# Patient Record
Sex: Female | Born: 1940 | Race: White | Hispanic: No | Smoking: Former smoker
Health system: Southern US, Community
[De-identification: ages and names within clinical notes are randomized; demographics above are authoritative.]

## PROBLEM LIST (undated history)

## (undated) DIAGNOSIS — F0390 Unspecified dementia without behavioral disturbance: Secondary | ICD-10-CM

## (undated) HISTORY — DX: Unspecified dementia, unspecified severity, without behavioral disturbance, psychotic disturbance, mood disturbance, and anxiety: F03.90

---

## 2015-12-15 DIAGNOSIS — H9190 Unspecified hearing loss, unspecified ear: Secondary | ICD-10-CM | POA: Diagnosis not present

## 2015-12-15 DIAGNOSIS — I10 Essential (primary) hypertension: Secondary | ICD-10-CM | POA: Diagnosis not present

## 2015-12-15 DIAGNOSIS — R2232 Localized swelling, mass and lump, left upper limb: Secondary | ICD-10-CM | POA: Diagnosis not present

## 2015-12-15 DIAGNOSIS — H6120 Impacted cerumen, unspecified ear: Secondary | ICD-10-CM | POA: Diagnosis not present

## 2015-12-15 DIAGNOSIS — Z111 Encounter for screening for respiratory tuberculosis: Secondary | ICD-10-CM | POA: Diagnosis not present

## 2015-12-16 DIAGNOSIS — I10 Essential (primary) hypertension: Secondary | ICD-10-CM | POA: Diagnosis not present

## 2015-12-17 DIAGNOSIS — H6120 Impacted cerumen, unspecified ear: Secondary | ICD-10-CM | POA: Diagnosis not present

## 2015-12-17 DIAGNOSIS — H9190 Unspecified hearing loss, unspecified ear: Secondary | ICD-10-CM | POA: Diagnosis not present

## 2015-12-22 DIAGNOSIS — R2232 Localized swelling, mass and lump, left upper limb: Secondary | ICD-10-CM | POA: Diagnosis not present

## 2015-12-28 DIAGNOSIS — I10 Essential (primary) hypertension: Secondary | ICD-10-CM | POA: Diagnosis not present

## 2015-12-28 DIAGNOSIS — W19XXXA Unspecified fall, initial encounter: Secondary | ICD-10-CM | POA: Diagnosis not present

## 2015-12-28 DIAGNOSIS — S72002A Fracture of unspecified part of neck of left femur, initial encounter for closed fracture: Secondary | ICD-10-CM | POA: Diagnosis not present

## 2015-12-28 DIAGNOSIS — F0390 Unspecified dementia without behavioral disturbance: Secondary | ICD-10-CM | POA: Diagnosis not present

## 2015-12-28 DIAGNOSIS — R259 Unspecified abnormal involuntary movements: Secondary | ICD-10-CM | POA: Diagnosis not present

## 2015-12-28 DIAGNOSIS — S72052A Unspecified fracture of head of left femur, initial encounter for closed fracture: Secondary | ICD-10-CM | POA: Diagnosis not present

## 2015-12-28 DIAGNOSIS — M25552 Pain in left hip: Secondary | ICD-10-CM | POA: Diagnosis not present

## 2015-12-28 DIAGNOSIS — Z87891 Personal history of nicotine dependence: Secondary | ICD-10-CM | POA: Diagnosis not present

## 2015-12-28 DIAGNOSIS — W19XXXD Unspecified fall, subsequent encounter: Secondary | ICD-10-CM | POA: Diagnosis not present

## 2015-12-28 DIAGNOSIS — Z888 Allergy status to other drugs, medicaments and biological substances status: Secondary | ICD-10-CM | POA: Diagnosis not present

## 2015-12-28 DIAGNOSIS — S72092A Other fracture of head and neck of left femur, initial encounter for closed fracture: Secondary | ICD-10-CM | POA: Diagnosis not present

## 2015-12-28 DIAGNOSIS — S72002D Fracture of unspecified part of neck of left femur, subsequent encounter for closed fracture with routine healing: Secondary | ICD-10-CM | POA: Diagnosis not present

## 2015-12-28 DIAGNOSIS — S299XXA Unspecified injury of thorax, initial encounter: Secondary | ICD-10-CM | POA: Diagnosis not present

## 2015-12-28 DIAGNOSIS — S72042A Displaced fracture of base of neck of left femur, initial encounter for closed fracture: Secondary | ICD-10-CM | POA: Diagnosis not present

## 2015-12-31 DIAGNOSIS — M25552 Pain in left hip: Secondary | ICD-10-CM | POA: Diagnosis not present

## 2015-12-31 DIAGNOSIS — R262 Difficulty in walking, not elsewhere classified: Secondary | ICD-10-CM | POA: Diagnosis not present

## 2015-12-31 DIAGNOSIS — D649 Anemia, unspecified: Secondary | ICD-10-CM | POA: Diagnosis not present

## 2015-12-31 DIAGNOSIS — W19XXXA Unspecified fall, initial encounter: Secondary | ICD-10-CM | POA: Diagnosis not present

## 2015-12-31 DIAGNOSIS — R259 Unspecified abnormal involuntary movements: Secondary | ICD-10-CM | POA: Diagnosis not present

## 2015-12-31 DIAGNOSIS — G8918 Other acute postprocedural pain: Secondary | ICD-10-CM | POA: Diagnosis not present

## 2015-12-31 DIAGNOSIS — W19XXXD Unspecified fall, subsequent encounter: Secondary | ICD-10-CM | POA: Diagnosis not present

## 2015-12-31 DIAGNOSIS — S72002D Fracture of unspecified part of neck of left femur, subsequent encounter for closed fracture with routine healing: Secondary | ICD-10-CM | POA: Diagnosis not present

## 2015-12-31 DIAGNOSIS — S72002A Fracture of unspecified part of neck of left femur, initial encounter for closed fracture: Secondary | ICD-10-CM | POA: Diagnosis not present

## 2015-12-31 DIAGNOSIS — I10 Essential (primary) hypertension: Secondary | ICD-10-CM | POA: Diagnosis not present

## 2015-12-31 DIAGNOSIS — F0390 Unspecified dementia without behavioral disturbance: Secondary | ICD-10-CM | POA: Diagnosis not present

## 2015-12-31 DIAGNOSIS — S72043A Displaced fracture of base of neck of unspecified femur, initial encounter for closed fracture: Secondary | ICD-10-CM | POA: Diagnosis not present

## 2016-01-07 DIAGNOSIS — R262 Difficulty in walking, not elsewhere classified: Secondary | ICD-10-CM | POA: Diagnosis not present

## 2016-01-07 DIAGNOSIS — G8918 Other acute postprocedural pain: Secondary | ICD-10-CM | POA: Diagnosis not present

## 2016-01-07 DIAGNOSIS — D649 Anemia, unspecified: Secondary | ICD-10-CM | POA: Diagnosis not present

## 2016-01-07 DIAGNOSIS — S72002D Fracture of unspecified part of neck of left femur, subsequent encounter for closed fracture with routine healing: Secondary | ICD-10-CM | POA: Diagnosis not present

## 2016-01-12 DIAGNOSIS — S72043A Displaced fracture of base of neck of unspecified femur, initial encounter for closed fracture: Secondary | ICD-10-CM | POA: Diagnosis not present

## 2016-02-15 DIAGNOSIS — D649 Anemia, unspecified: Secondary | ICD-10-CM | POA: Diagnosis not present

## 2016-02-15 DIAGNOSIS — R5381 Other malaise: Secondary | ICD-10-CM | POA: Diagnosis not present

## 2016-02-15 DIAGNOSIS — K219 Gastro-esophageal reflux disease without esophagitis: Secondary | ICD-10-CM | POA: Diagnosis not present

## 2016-02-15 DIAGNOSIS — M439 Deforming dorsopathy, unspecified: Secondary | ICD-10-CM | POA: Diagnosis not present

## 2016-02-15 DIAGNOSIS — M2550 Pain in unspecified joint: Secondary | ICD-10-CM | POA: Diagnosis not present

## 2016-02-15 DIAGNOSIS — I119 Hypertensive heart disease without heart failure: Secondary | ICD-10-CM | POA: Diagnosis not present

## 2016-02-15 DIAGNOSIS — F0390 Unspecified dementia without behavioral disturbance: Secondary | ICD-10-CM | POA: Diagnosis not present

## 2016-02-24 DIAGNOSIS — I509 Heart failure, unspecified: Secondary | ICD-10-CM | POA: Diagnosis not present

## 2016-02-24 DIAGNOSIS — D649 Anemia, unspecified: Secondary | ICD-10-CM | POA: Diagnosis not present

## 2016-03-15 DIAGNOSIS — R5381 Other malaise: Secondary | ICD-10-CM | POA: Diagnosis not present

## 2016-03-15 DIAGNOSIS — M2550 Pain in unspecified joint: Secondary | ICD-10-CM | POA: Diagnosis not present

## 2016-03-15 DIAGNOSIS — K219 Gastro-esophageal reflux disease without esophagitis: Secondary | ICD-10-CM | POA: Diagnosis not present

## 2016-03-15 DIAGNOSIS — M439 Deforming dorsopathy, unspecified: Secondary | ICD-10-CM | POA: Diagnosis not present

## 2016-03-15 DIAGNOSIS — D649 Anemia, unspecified: Secondary | ICD-10-CM | POA: Diagnosis not present

## 2016-03-15 DIAGNOSIS — F0390 Unspecified dementia without behavioral disturbance: Secondary | ICD-10-CM | POA: Diagnosis not present

## 2016-03-15 DIAGNOSIS — I119 Hypertensive heart disease without heart failure: Secondary | ICD-10-CM | POA: Diagnosis not present

## 2016-04-14 DIAGNOSIS — K5900 Constipation, unspecified: Secondary | ICD-10-CM | POA: Diagnosis not present

## 2016-04-14 DIAGNOSIS — I119 Hypertensive heart disease without heart failure: Secondary | ICD-10-CM | POA: Diagnosis not present

## 2016-04-14 DIAGNOSIS — F0390 Unspecified dementia without behavioral disturbance: Secondary | ICD-10-CM | POA: Diagnosis not present

## 2016-04-14 DIAGNOSIS — M439 Deforming dorsopathy, unspecified: Secondary | ICD-10-CM | POA: Diagnosis not present

## 2016-04-14 DIAGNOSIS — D649 Anemia, unspecified: Secondary | ICD-10-CM | POA: Diagnosis not present

## 2016-04-14 DIAGNOSIS — M2550 Pain in unspecified joint: Secondary | ICD-10-CM | POA: Diagnosis not present

## 2016-04-14 DIAGNOSIS — R627 Adult failure to thrive: Secondary | ICD-10-CM | POA: Diagnosis not present

## 2016-04-19 DIAGNOSIS — S72043A Displaced fracture of base of neck of unspecified femur, initial encounter for closed fracture: Secondary | ICD-10-CM | POA: Diagnosis not present

## 2016-05-14 DIAGNOSIS — I119 Hypertensive heart disease without heart failure: Secondary | ICD-10-CM | POA: Diagnosis not present

## 2016-05-14 DIAGNOSIS — K5900 Constipation, unspecified: Secondary | ICD-10-CM | POA: Diagnosis not present

## 2016-05-14 DIAGNOSIS — D649 Anemia, unspecified: Secondary | ICD-10-CM | POA: Diagnosis not present

## 2016-05-14 DIAGNOSIS — F0390 Unspecified dementia without behavioral disturbance: Secondary | ICD-10-CM | POA: Diagnosis not present

## 2016-05-14 DIAGNOSIS — R627 Adult failure to thrive: Secondary | ICD-10-CM | POA: Diagnosis not present

## 2016-05-14 DIAGNOSIS — M2550 Pain in unspecified joint: Secondary | ICD-10-CM | POA: Diagnosis not present

## 2016-05-14 DIAGNOSIS — M439 Deforming dorsopathy, unspecified: Secondary | ICD-10-CM | POA: Diagnosis not present

## 2016-06-15 DIAGNOSIS — M439 Deforming dorsopathy, unspecified: Secondary | ICD-10-CM | POA: Diagnosis not present

## 2016-06-15 DIAGNOSIS — F0390 Unspecified dementia without behavioral disturbance: Secondary | ICD-10-CM | POA: Diagnosis not present

## 2016-06-15 DIAGNOSIS — R5381 Other malaise: Secondary | ICD-10-CM | POA: Diagnosis not present

## 2016-06-15 DIAGNOSIS — D649 Anemia, unspecified: Secondary | ICD-10-CM | POA: Diagnosis not present

## 2016-06-15 DIAGNOSIS — I119 Hypertensive heart disease without heart failure: Secondary | ICD-10-CM | POA: Diagnosis not present

## 2016-06-15 DIAGNOSIS — M2550 Pain in unspecified joint: Secondary | ICD-10-CM | POA: Diagnosis not present

## 2016-06-15 DIAGNOSIS — K219 Gastro-esophageal reflux disease without esophagitis: Secondary | ICD-10-CM | POA: Diagnosis not present

## 2016-07-14 DIAGNOSIS — I119 Hypertensive heart disease without heart failure: Secondary | ICD-10-CM | POA: Diagnosis not present

## 2016-07-14 DIAGNOSIS — F0390 Unspecified dementia without behavioral disturbance: Secondary | ICD-10-CM | POA: Diagnosis not present

## 2016-07-14 DIAGNOSIS — K219 Gastro-esophageal reflux disease without esophagitis: Secondary | ICD-10-CM | POA: Diagnosis not present

## 2016-07-14 DIAGNOSIS — M2550 Pain in unspecified joint: Secondary | ICD-10-CM | POA: Diagnosis not present

## 2016-07-14 DIAGNOSIS — R5381 Other malaise: Secondary | ICD-10-CM | POA: Diagnosis not present

## 2016-07-14 DIAGNOSIS — D649 Anemia, unspecified: Secondary | ICD-10-CM | POA: Diagnosis not present

## 2016-07-14 DIAGNOSIS — M439 Deforming dorsopathy, unspecified: Secondary | ICD-10-CM | POA: Diagnosis not present

## 2016-07-20 DIAGNOSIS — S72043A Displaced fracture of base of neck of unspecified femur, initial encounter for closed fracture: Secondary | ICD-10-CM | POA: Diagnosis not present

## 2016-08-15 DIAGNOSIS — I119 Hypertensive heart disease without heart failure: Secondary | ICD-10-CM | POA: Diagnosis not present

## 2016-08-15 DIAGNOSIS — R5381 Other malaise: Secondary | ICD-10-CM | POA: Diagnosis not present

## 2016-08-15 DIAGNOSIS — M2550 Pain in unspecified joint: Secondary | ICD-10-CM | POA: Diagnosis not present

## 2016-08-15 DIAGNOSIS — D649 Anemia, unspecified: Secondary | ICD-10-CM | POA: Diagnosis not present

## 2016-08-15 DIAGNOSIS — F0390 Unspecified dementia without behavioral disturbance: Secondary | ICD-10-CM | POA: Diagnosis not present

## 2016-08-15 DIAGNOSIS — K5900 Constipation, unspecified: Secondary | ICD-10-CM | POA: Diagnosis not present

## 2016-08-15 DIAGNOSIS — R627 Adult failure to thrive: Secondary | ICD-10-CM | POA: Diagnosis not present

## 2016-08-24 DIAGNOSIS — R05 Cough: Secondary | ICD-10-CM | POA: Diagnosis not present

## 2016-08-24 DIAGNOSIS — R0989 Other specified symptoms and signs involving the circulatory and respiratory systems: Secondary | ICD-10-CM | POA: Diagnosis not present

## 2016-08-31 DIAGNOSIS — I509 Heart failure, unspecified: Secondary | ICD-10-CM | POA: Diagnosis not present

## 2016-08-31 DIAGNOSIS — D649 Anemia, unspecified: Secondary | ICD-10-CM | POA: Diagnosis not present

## 2016-09-01 DIAGNOSIS — D2110 Benign neoplasm of connective and other soft tissue of unspecified upper limb, including shoulder: Secondary | ICD-10-CM | POA: Diagnosis not present

## 2016-09-01 DIAGNOSIS — I10 Essential (primary) hypertension: Secondary | ICD-10-CM | POA: Diagnosis not present

## 2016-09-01 DIAGNOSIS — D179 Benign lipomatous neoplasm, unspecified: Secondary | ICD-10-CM | POA: Diagnosis not present

## 2016-09-01 DIAGNOSIS — L819 Disorder of pigmentation, unspecified: Secondary | ICD-10-CM | POA: Diagnosis not present

## 2016-09-09 DIAGNOSIS — D2262 Melanocytic nevi of left upper limb, including shoulder: Secondary | ICD-10-CM | POA: Diagnosis not present

## 2016-09-09 DIAGNOSIS — D2362 Other benign neoplasm of skin of left upper limb, including shoulder: Secondary | ICD-10-CM | POA: Diagnosis not present

## 2016-09-09 DIAGNOSIS — D1779 Benign lipomatous neoplasm of other sites: Secondary | ICD-10-CM | POA: Diagnosis not present

## 2016-09-09 DIAGNOSIS — D1722 Benign lipomatous neoplasm of skin and subcutaneous tissue of left arm: Secondary | ICD-10-CM | POA: Diagnosis not present

## 2016-09-14 DIAGNOSIS — D649 Anemia, unspecified: Secondary | ICD-10-CM | POA: Diagnosis not present

## 2016-09-14 DIAGNOSIS — F0390 Unspecified dementia without behavioral disturbance: Secondary | ICD-10-CM | POA: Diagnosis not present

## 2016-09-14 DIAGNOSIS — K5900 Constipation, unspecified: Secondary | ICD-10-CM | POA: Diagnosis not present

## 2016-09-14 DIAGNOSIS — I119 Hypertensive heart disease without heart failure: Secondary | ICD-10-CM | POA: Diagnosis not present

## 2016-09-14 DIAGNOSIS — R627 Adult failure to thrive: Secondary | ICD-10-CM | POA: Diagnosis not present

## 2016-09-14 DIAGNOSIS — R5381 Other malaise: Secondary | ICD-10-CM | POA: Diagnosis not present

## 2016-09-14 DIAGNOSIS — M2550 Pain in unspecified joint: Secondary | ICD-10-CM | POA: Diagnosis not present

## 2016-10-14 DIAGNOSIS — R5381 Other malaise: Secondary | ICD-10-CM | POA: Diagnosis not present

## 2016-10-14 DIAGNOSIS — R627 Adult failure to thrive: Secondary | ICD-10-CM | POA: Diagnosis not present

## 2016-10-14 DIAGNOSIS — K5900 Constipation, unspecified: Secondary | ICD-10-CM | POA: Diagnosis not present

## 2016-10-14 DIAGNOSIS — M2550 Pain in unspecified joint: Secondary | ICD-10-CM | POA: Diagnosis not present

## 2016-10-14 DIAGNOSIS — F0390 Unspecified dementia without behavioral disturbance: Secondary | ICD-10-CM | POA: Diagnosis not present

## 2016-10-14 DIAGNOSIS — K219 Gastro-esophageal reflux disease without esophagitis: Secondary | ICD-10-CM | POA: Diagnosis not present

## 2016-10-14 DIAGNOSIS — D649 Anemia, unspecified: Secondary | ICD-10-CM | POA: Diagnosis not present

## 2016-10-14 DIAGNOSIS — M439 Deforming dorsopathy, unspecified: Secondary | ICD-10-CM | POA: Diagnosis not present

## 2016-10-20 DIAGNOSIS — L300 Nummular dermatitis: Secondary | ICD-10-CM | POA: Diagnosis not present

## 2016-11-14 DIAGNOSIS — R5381 Other malaise: Secondary | ICD-10-CM | POA: Diagnosis not present

## 2016-11-14 DIAGNOSIS — D649 Anemia, unspecified: Secondary | ICD-10-CM | POA: Diagnosis not present

## 2016-11-14 DIAGNOSIS — M439 Deforming dorsopathy, unspecified: Secondary | ICD-10-CM | POA: Diagnosis not present

## 2016-11-14 DIAGNOSIS — M2550 Pain in unspecified joint: Secondary | ICD-10-CM | POA: Diagnosis not present

## 2016-11-14 DIAGNOSIS — K219 Gastro-esophageal reflux disease without esophagitis: Secondary | ICD-10-CM | POA: Diagnosis not present

## 2016-11-14 DIAGNOSIS — K5900 Constipation, unspecified: Secondary | ICD-10-CM | POA: Diagnosis not present

## 2016-11-14 DIAGNOSIS — R627 Adult failure to thrive: Secondary | ICD-10-CM | POA: Diagnosis not present

## 2016-11-14 DIAGNOSIS — F0390 Unspecified dementia without behavioral disturbance: Secondary | ICD-10-CM | POA: Diagnosis not present

## 2016-11-28 DIAGNOSIS — B354 Tinea corporis: Secondary | ICD-10-CM | POA: Diagnosis not present

## 2016-11-28 DIAGNOSIS — B351 Tinea unguium: Secondary | ICD-10-CM | POA: Diagnosis not present

## 2016-11-28 DIAGNOSIS — B353 Tinea pedis: Secondary | ICD-10-CM | POA: Diagnosis not present

## 2016-12-14 DIAGNOSIS — F0390 Unspecified dementia without behavioral disturbance: Secondary | ICD-10-CM | POA: Diagnosis not present

## 2016-12-14 DIAGNOSIS — M2550 Pain in unspecified joint: Secondary | ICD-10-CM | POA: Diagnosis not present

## 2016-12-14 DIAGNOSIS — R29898 Other symptoms and signs involving the musculoskeletal system: Secondary | ICD-10-CM | POA: Diagnosis not present

## 2016-12-14 DIAGNOSIS — I119 Hypertensive heart disease without heart failure: Secondary | ICD-10-CM | POA: Diagnosis not present

## 2017-01-02 DIAGNOSIS — B351 Tinea unguium: Secondary | ICD-10-CM | POA: Diagnosis not present

## 2017-01-03 DIAGNOSIS — M25561 Pain in right knee: Secondary | ICD-10-CM | POA: Diagnosis not present

## 2017-01-07 DIAGNOSIS — R05 Cough: Secondary | ICD-10-CM | POA: Diagnosis not present

## 2017-01-16 DIAGNOSIS — K5900 Constipation, unspecified: Secondary | ICD-10-CM | POA: Diagnosis not present

## 2017-01-16 DIAGNOSIS — M810 Age-related osteoporosis without current pathological fracture: Secondary | ICD-10-CM | POA: Diagnosis not present

## 2017-01-16 DIAGNOSIS — I119 Hypertensive heart disease without heart failure: Secondary | ICD-10-CM | POA: Diagnosis not present

## 2017-01-16 DIAGNOSIS — M2550 Pain in unspecified joint: Secondary | ICD-10-CM | POA: Diagnosis not present

## 2017-01-16 DIAGNOSIS — K219 Gastro-esophageal reflux disease without esophagitis: Secondary | ICD-10-CM | POA: Diagnosis not present

## 2017-01-16 DIAGNOSIS — D649 Anemia, unspecified: Secondary | ICD-10-CM | POA: Diagnosis not present

## 2017-01-16 DIAGNOSIS — R627 Adult failure to thrive: Secondary | ICD-10-CM | POA: Diagnosis not present

## 2017-01-16 DIAGNOSIS — R29898 Other symptoms and signs involving the musculoskeletal system: Secondary | ICD-10-CM | POA: Diagnosis not present

## 2017-01-16 DIAGNOSIS — F0390 Unspecified dementia without behavioral disturbance: Secondary | ICD-10-CM | POA: Diagnosis not present

## 2017-02-15 DIAGNOSIS — R29898 Other symptoms and signs involving the musculoskeletal system: Secondary | ICD-10-CM | POA: Diagnosis not present

## 2017-02-15 DIAGNOSIS — K219 Gastro-esophageal reflux disease without esophagitis: Secondary | ICD-10-CM | POA: Diagnosis not present

## 2017-02-15 DIAGNOSIS — R627 Adult failure to thrive: Secondary | ICD-10-CM | POA: Diagnosis not present

## 2017-02-15 DIAGNOSIS — D649 Anemia, unspecified: Secondary | ICD-10-CM | POA: Diagnosis not present

## 2017-02-15 DIAGNOSIS — M439 Deforming dorsopathy, unspecified: Secondary | ICD-10-CM | POA: Diagnosis not present

## 2017-02-15 DIAGNOSIS — I119 Hypertensive heart disease without heart failure: Secondary | ICD-10-CM | POA: Diagnosis not present

## 2017-02-15 DIAGNOSIS — F0390 Unspecified dementia without behavioral disturbance: Secondary | ICD-10-CM | POA: Diagnosis not present

## 2017-02-15 DIAGNOSIS — M2550 Pain in unspecified joint: Secondary | ICD-10-CM | POA: Diagnosis not present

## 2017-03-01 DIAGNOSIS — I509 Heart failure, unspecified: Secondary | ICD-10-CM | POA: Diagnosis not present

## 2017-03-01 DIAGNOSIS — D649 Anemia, unspecified: Secondary | ICD-10-CM | POA: Diagnosis not present

## 2017-03-15 DIAGNOSIS — I119 Hypertensive heart disease without heart failure: Secondary | ICD-10-CM | POA: Diagnosis not present

## 2017-03-15 DIAGNOSIS — M439 Deforming dorsopathy, unspecified: Secondary | ICD-10-CM | POA: Diagnosis not present

## 2017-03-15 DIAGNOSIS — R29898 Other symptoms and signs involving the musculoskeletal system: Secondary | ICD-10-CM | POA: Diagnosis not present

## 2017-03-15 DIAGNOSIS — K219 Gastro-esophageal reflux disease without esophagitis: Secondary | ICD-10-CM | POA: Diagnosis not present

## 2017-03-15 DIAGNOSIS — M2550 Pain in unspecified joint: Secondary | ICD-10-CM | POA: Diagnosis not present

## 2017-03-15 DIAGNOSIS — D649 Anemia, unspecified: Secondary | ICD-10-CM | POA: Diagnosis not present

## 2017-03-15 DIAGNOSIS — R627 Adult failure to thrive: Secondary | ICD-10-CM | POA: Diagnosis not present

## 2017-03-15 DIAGNOSIS — F0390 Unspecified dementia without behavioral disturbance: Secondary | ICD-10-CM | POA: Diagnosis not present

## 2017-04-14 DIAGNOSIS — D649 Anemia, unspecified: Secondary | ICD-10-CM | POA: Diagnosis not present

## 2017-04-14 DIAGNOSIS — K219 Gastro-esophageal reflux disease without esophagitis: Secondary | ICD-10-CM | POA: Diagnosis not present

## 2017-04-14 DIAGNOSIS — M2550 Pain in unspecified joint: Secondary | ICD-10-CM | POA: Diagnosis not present

## 2017-04-14 DIAGNOSIS — R627 Adult failure to thrive: Secondary | ICD-10-CM | POA: Diagnosis not present

## 2017-04-14 DIAGNOSIS — M439 Deforming dorsopathy, unspecified: Secondary | ICD-10-CM | POA: Diagnosis not present

## 2017-04-14 DIAGNOSIS — F0390 Unspecified dementia without behavioral disturbance: Secondary | ICD-10-CM | POA: Diagnosis not present

## 2017-04-14 DIAGNOSIS — I119 Hypertensive heart disease without heart failure: Secondary | ICD-10-CM | POA: Diagnosis not present

## 2017-04-14 DIAGNOSIS — R29898 Other symptoms and signs involving the musculoskeletal system: Secondary | ICD-10-CM | POA: Diagnosis not present

## 2017-05-04 DIAGNOSIS — B351 Tinea unguium: Secondary | ICD-10-CM | POA: Diagnosis not present

## 2017-05-15 DIAGNOSIS — R29898 Other symptoms and signs involving the musculoskeletal system: Secondary | ICD-10-CM | POA: Diagnosis not present

## 2017-05-15 DIAGNOSIS — K219 Gastro-esophageal reflux disease without esophagitis: Secondary | ICD-10-CM | POA: Diagnosis not present

## 2017-05-15 DIAGNOSIS — D649 Anemia, unspecified: Secondary | ICD-10-CM | POA: Diagnosis not present

## 2017-05-15 DIAGNOSIS — R627 Adult failure to thrive: Secondary | ICD-10-CM | POA: Diagnosis not present

## 2017-05-15 DIAGNOSIS — F0390 Unspecified dementia without behavioral disturbance: Secondary | ICD-10-CM | POA: Diagnosis not present

## 2017-05-15 DIAGNOSIS — I119 Hypertensive heart disease without heart failure: Secondary | ICD-10-CM | POA: Diagnosis not present

## 2017-05-15 DIAGNOSIS — M439 Deforming dorsopathy, unspecified: Secondary | ICD-10-CM | POA: Diagnosis not present

## 2017-05-15 DIAGNOSIS — M2550 Pain in unspecified joint: Secondary | ICD-10-CM | POA: Diagnosis not present

## 2017-06-17 DIAGNOSIS — K219 Gastro-esophageal reflux disease without esophagitis: Secondary | ICD-10-CM | POA: Diagnosis not present

## 2017-06-17 DIAGNOSIS — F0390 Unspecified dementia without behavioral disturbance: Secondary | ICD-10-CM | POA: Diagnosis not present

## 2017-06-17 DIAGNOSIS — K5900 Constipation, unspecified: Secondary | ICD-10-CM | POA: Diagnosis not present

## 2017-06-17 DIAGNOSIS — M2550 Pain in unspecified joint: Secondary | ICD-10-CM | POA: Diagnosis not present

## 2017-06-17 DIAGNOSIS — D649 Anemia, unspecified: Secondary | ICD-10-CM | POA: Diagnosis not present

## 2017-06-17 DIAGNOSIS — R627 Adult failure to thrive: Secondary | ICD-10-CM | POA: Diagnosis not present

## 2017-06-17 DIAGNOSIS — M810 Age-related osteoporosis without current pathological fracture: Secondary | ICD-10-CM | POA: Diagnosis not present

## 2017-07-16 DIAGNOSIS — M2550 Pain in unspecified joint: Secondary | ICD-10-CM | POA: Diagnosis not present

## 2017-07-16 DIAGNOSIS — R627 Adult failure to thrive: Secondary | ICD-10-CM | POA: Diagnosis not present

## 2017-07-16 DIAGNOSIS — F0390 Unspecified dementia without behavioral disturbance: Secondary | ICD-10-CM | POA: Diagnosis not present

## 2017-07-16 DIAGNOSIS — M810 Age-related osteoporosis without current pathological fracture: Secondary | ICD-10-CM | POA: Diagnosis not present

## 2017-07-16 DIAGNOSIS — K219 Gastro-esophageal reflux disease without esophagitis: Secondary | ICD-10-CM | POA: Diagnosis not present

## 2017-07-16 DIAGNOSIS — K5900 Constipation, unspecified: Secondary | ICD-10-CM | POA: Diagnosis not present

## 2017-07-16 DIAGNOSIS — D649 Anemia, unspecified: Secondary | ICD-10-CM | POA: Diagnosis not present

## 2017-07-20 DIAGNOSIS — N390 Urinary tract infection, site not specified: Secondary | ICD-10-CM | POA: Diagnosis not present

## 2017-07-20 DIAGNOSIS — D649 Anemia, unspecified: Secondary | ICD-10-CM | POA: Diagnosis not present

## 2017-07-20 DIAGNOSIS — I509 Heart failure, unspecified: Secondary | ICD-10-CM | POA: Diagnosis not present

## 2017-07-20 DIAGNOSIS — R319 Hematuria, unspecified: Secondary | ICD-10-CM | POA: Diagnosis not present

## 2017-08-16 DIAGNOSIS — K219 Gastro-esophageal reflux disease without esophagitis: Secondary | ICD-10-CM | POA: Diagnosis not present

## 2017-08-16 DIAGNOSIS — R29898 Other symptoms and signs involving the musculoskeletal system: Secondary | ICD-10-CM | POA: Diagnosis not present

## 2017-08-16 DIAGNOSIS — M810 Age-related osteoporosis without current pathological fracture: Secondary | ICD-10-CM | POA: Diagnosis not present

## 2017-08-16 DIAGNOSIS — M2550 Pain in unspecified joint: Secondary | ICD-10-CM | POA: Diagnosis not present

## 2017-08-16 DIAGNOSIS — I119 Hypertensive heart disease without heart failure: Secondary | ICD-10-CM | POA: Diagnosis not present

## 2017-08-16 DIAGNOSIS — F0390 Unspecified dementia without behavioral disturbance: Secondary | ICD-10-CM | POA: Diagnosis not present

## 2017-08-23 DIAGNOSIS — I509 Heart failure, unspecified: Secondary | ICD-10-CM | POA: Diagnosis not present

## 2017-08-23 DIAGNOSIS — D649 Anemia, unspecified: Secondary | ICD-10-CM | POA: Diagnosis not present

## 2017-09-15 DIAGNOSIS — R29898 Other symptoms and signs involving the musculoskeletal system: Secondary | ICD-10-CM | POA: Diagnosis not present

## 2017-09-15 DIAGNOSIS — M2550 Pain in unspecified joint: Secondary | ICD-10-CM | POA: Diagnosis not present

## 2017-09-15 DIAGNOSIS — I119 Hypertensive heart disease without heart failure: Secondary | ICD-10-CM | POA: Diagnosis not present

## 2017-09-15 DIAGNOSIS — M810 Age-related osteoporosis without current pathological fracture: Secondary | ICD-10-CM | POA: Diagnosis not present

## 2017-09-15 DIAGNOSIS — K219 Gastro-esophageal reflux disease without esophagitis: Secondary | ICD-10-CM | POA: Diagnosis not present

## 2017-09-15 DIAGNOSIS — F0390 Unspecified dementia without behavioral disturbance: Secondary | ICD-10-CM | POA: Diagnosis not present

## 2017-10-15 DIAGNOSIS — M2550 Pain in unspecified joint: Secondary | ICD-10-CM | POA: Diagnosis not present

## 2017-10-15 DIAGNOSIS — K219 Gastro-esophageal reflux disease without esophagitis: Secondary | ICD-10-CM | POA: Diagnosis not present

## 2017-10-15 DIAGNOSIS — M810 Age-related osteoporosis without current pathological fracture: Secondary | ICD-10-CM | POA: Diagnosis not present

## 2017-10-15 DIAGNOSIS — E441 Mild protein-calorie malnutrition: Secondary | ICD-10-CM | POA: Diagnosis not present

## 2017-10-15 DIAGNOSIS — K5900 Constipation, unspecified: Secondary | ICD-10-CM | POA: Diagnosis not present

## 2017-10-15 DIAGNOSIS — F0390 Unspecified dementia without behavioral disturbance: Secondary | ICD-10-CM | POA: Diagnosis not present

## 2017-10-15 DIAGNOSIS — D649 Anemia, unspecified: Secondary | ICD-10-CM | POA: Diagnosis not present

## 2017-10-15 DIAGNOSIS — R29898 Other symptoms and signs involving the musculoskeletal system: Secondary | ICD-10-CM | POA: Diagnosis not present

## 2017-11-02 DIAGNOSIS — R05 Cough: Secondary | ICD-10-CM | POA: Diagnosis not present

## 2017-11-15 DIAGNOSIS — K219 Gastro-esophageal reflux disease without esophagitis: Secondary | ICD-10-CM | POA: Diagnosis not present

## 2017-11-15 DIAGNOSIS — E441 Mild protein-calorie malnutrition: Secondary | ICD-10-CM | POA: Diagnosis not present

## 2017-11-15 DIAGNOSIS — M2550 Pain in unspecified joint: Secondary | ICD-10-CM | POA: Diagnosis not present

## 2017-11-15 DIAGNOSIS — D649 Anemia, unspecified: Secondary | ICD-10-CM | POA: Diagnosis not present

## 2017-11-15 DIAGNOSIS — R29898 Other symptoms and signs involving the musculoskeletal system: Secondary | ICD-10-CM | POA: Diagnosis not present

## 2017-11-15 DIAGNOSIS — F0390 Unspecified dementia without behavioral disturbance: Secondary | ICD-10-CM | POA: Diagnosis not present

## 2017-11-15 DIAGNOSIS — K5900 Constipation, unspecified: Secondary | ICD-10-CM | POA: Diagnosis not present

## 2017-11-15 DIAGNOSIS — M810 Age-related osteoporosis without current pathological fracture: Secondary | ICD-10-CM | POA: Diagnosis not present

## 2017-12-14 DIAGNOSIS — N950 Postmenopausal bleeding: Secondary | ICD-10-CM | POA: Diagnosis not present

## 2017-12-14 DIAGNOSIS — N858 Other specified noninflammatory disorders of uterus: Secondary | ICD-10-CM | POA: Diagnosis not present

## 2017-12-15 DIAGNOSIS — I119 Hypertensive heart disease without heart failure: Secondary | ICD-10-CM | POA: Diagnosis not present

## 2017-12-15 DIAGNOSIS — K5900 Constipation, unspecified: Secondary | ICD-10-CM | POA: Diagnosis not present

## 2017-12-15 DIAGNOSIS — D649 Anemia, unspecified: Secondary | ICD-10-CM | POA: Diagnosis not present

## 2017-12-15 DIAGNOSIS — F0390 Unspecified dementia without behavioral disturbance: Secondary | ICD-10-CM | POA: Diagnosis not present

## 2017-12-15 DIAGNOSIS — R5381 Other malaise: Secondary | ICD-10-CM | POA: Diagnosis not present

## 2017-12-15 DIAGNOSIS — M810 Age-related osteoporosis without current pathological fracture: Secondary | ICD-10-CM | POA: Diagnosis not present

## 2017-12-15 DIAGNOSIS — K219 Gastro-esophageal reflux disease without esophagitis: Secondary | ICD-10-CM | POA: Diagnosis not present

## 2021-02-26 ENCOUNTER — Inpatient Hospital Stay (HOSPITAL_COMMUNITY)
Admission: AD | Admit: 2021-02-26 | Discharge: 2021-03-09 | DRG: 178 | Disposition: A | Discharge status: Skilled Nursing Facility | Payer: Medicare Other | Source: Other Acute Inpatient Hospital | Attending: Internal Medicine | Admitting: Internal Medicine

## 2021-02-26 DIAGNOSIS — D75839 Thrombocytosis, unspecified: Secondary | ICD-10-CM | POA: Diagnosis present

## 2021-02-26 DIAGNOSIS — D62 Acute posthemorrhagic anemia: Secondary | ICD-10-CM | POA: Diagnosis present

## 2021-02-26 DIAGNOSIS — J939 Pneumothorax, unspecified: Secondary | ICD-10-CM

## 2021-02-26 DIAGNOSIS — N390 Urinary tract infection, site not specified: Secondary | ICD-10-CM | POA: Diagnosis present

## 2021-02-26 DIAGNOSIS — R7989 Other specified abnormal findings of blood chemistry: Secondary | ICD-10-CM | POA: Diagnosis present

## 2021-02-26 DIAGNOSIS — R195 Other fecal abnormalities: Secondary | ICD-10-CM | POA: Diagnosis present

## 2021-02-26 DIAGNOSIS — Z87891 Personal history of nicotine dependence: Secondary | ICD-10-CM

## 2021-02-26 DIAGNOSIS — R55 Syncope and collapse: Secondary | ICD-10-CM

## 2021-02-26 DIAGNOSIS — I110 Hypertensive heart disease with heart failure: Secondary | ICD-10-CM | POA: Diagnosis present

## 2021-02-26 DIAGNOSIS — M1990 Unspecified osteoarthritis, unspecified site: Secondary | ICD-10-CM | POA: Diagnosis present

## 2021-02-26 DIAGNOSIS — I951 Orthostatic hypotension: Secondary | ICD-10-CM | POA: Diagnosis present

## 2021-02-26 DIAGNOSIS — Z20822 Contact with and (suspected) exposure to covid-19: Secondary | ICD-10-CM | POA: Diagnosis present

## 2021-02-26 DIAGNOSIS — I5032 Chronic diastolic (congestive) heart failure: Secondary | ICD-10-CM | POA: Diagnosis present

## 2021-02-26 DIAGNOSIS — Z9689 Presence of other specified functional implants: Secondary | ICD-10-CM

## 2021-02-26 DIAGNOSIS — M25531 Pain in right wrist: Secondary | ICD-10-CM | POA: Diagnosis present

## 2021-02-26 DIAGNOSIS — J869 Pyothorax without fistula: Secondary | ICD-10-CM | POA: Diagnosis not present

## 2021-02-26 DIAGNOSIS — W19XXXA Unspecified fall, initial encounter: Secondary | ICD-10-CM

## 2021-02-26 DIAGNOSIS — F0391 Unspecified dementia with behavioral disturbance: Secondary | ICD-10-CM | POA: Diagnosis present

## 2021-02-26 DIAGNOSIS — R5381 Other malaise: Secondary | ICD-10-CM | POA: Diagnosis present

## 2021-02-26 DIAGNOSIS — R0902 Hypoxemia: Secondary | ICD-10-CM | POA: Diagnosis present

## 2021-02-26 DIAGNOSIS — I10 Essential (primary) hypertension: Secondary | ICD-10-CM

## 2021-02-26 DIAGNOSIS — J90 Pleural effusion, not elsewhere classified: Secondary | ICD-10-CM

## 2021-02-26 DIAGNOSIS — K219 Gastro-esophageal reflux disease without esophagitis: Secondary | ICD-10-CM | POA: Diagnosis present

## 2021-02-26 DIAGNOSIS — D649 Anemia, unspecified: Secondary | ICD-10-CM

## 2021-02-26 DIAGNOSIS — F0390 Unspecified dementia without behavioral disturbance: Secondary | ICD-10-CM

## 2021-02-26 DIAGNOSIS — K922 Gastrointestinal hemorrhage, unspecified: Secondary | ICD-10-CM

## 2021-02-26 NOTE — Progress Notes (Signed)
Patient arrived to room 2w36 from [place].  Assessment complete, VS obtained, and Admission database began.

## 2021-02-27 ENCOUNTER — Encounter (HOSPITAL_COMMUNITY): Payer: Self-pay | Admitting: Internal Medicine

## 2021-02-27 ENCOUNTER — Inpatient Hospital Stay (HOSPITAL_COMMUNITY): Payer: Medicare Other

## 2021-02-27 ENCOUNTER — Other Ambulatory Visit: Payer: Self-pay

## 2021-02-27 DIAGNOSIS — K922 Gastrointestinal hemorrhage, unspecified: Secondary | ICD-10-CM

## 2021-02-27 DIAGNOSIS — I10 Essential (primary) hypertension: Secondary | ICD-10-CM

## 2021-02-27 DIAGNOSIS — F0390 Unspecified dementia without behavioral disturbance: Secondary | ICD-10-CM

## 2021-02-27 DIAGNOSIS — J869 Pyothorax without fistula: Secondary | ICD-10-CM | POA: Diagnosis not present

## 2021-02-27 DIAGNOSIS — R0902 Hypoxemia: Secondary | ICD-10-CM

## 2021-02-27 DIAGNOSIS — D649 Anemia, unspecified: Secondary | ICD-10-CM

## 2021-02-27 DIAGNOSIS — R55 Syncope and collapse: Secondary | ICD-10-CM

## 2021-02-27 LAB — CBC WITH DIFFERENTIAL/PLATELET
Abs Immature Granulocytes: 0.38 10*3/uL — ABNORMAL HIGH (ref 0.00–0.07)
Basophils Absolute: 0.1 10*3/uL (ref 0.0–0.1)
Basophils Relative: 1 %
Eosinophils Absolute: 0.2 10*3/uL (ref 0.0–0.5)
Eosinophils Relative: 1 %
HCT: 21.4 % — ABNORMAL LOW (ref 36.0–46.0)
Hemoglobin: 6.5 g/dL — CL (ref 12.0–15.0)
Immature Granulocytes: 3 %
Lymphocytes Relative: 15 %
Lymphs Abs: 1.9 10*3/uL (ref 0.7–4.0)
MCH: 22.1 pg — ABNORMAL LOW (ref 26.0–34.0)
MCHC: 30.4 g/dL (ref 30.0–36.0)
MCV: 72.8 fL — ABNORMAL LOW (ref 80.0–100.0)
Monocytes Absolute: 1.1 10*3/uL — ABNORMAL HIGH (ref 0.1–1.0)
Monocytes Relative: 8 %
Neutro Abs: 9.2 10*3/uL — ABNORMAL HIGH (ref 1.7–7.7)
Neutrophils Relative %: 72 %
Platelets: 634 10*3/uL — ABNORMAL HIGH (ref 150–400)
RBC: 2.94 MIL/uL — ABNORMAL LOW (ref 3.87–5.11)
RDW: 18.2 % — ABNORMAL HIGH (ref 11.5–15.5)
WBC: 12.8 10*3/uL — ABNORMAL HIGH (ref 4.0–10.5)
nRBC: 0.2 % (ref 0.0–0.2)

## 2021-02-27 LAB — URINALYSIS, ROUTINE W REFLEX MICROSCOPIC
Bilirubin Urine: NEGATIVE
Glucose, UA: NEGATIVE mg/dL
Ketones, ur: NEGATIVE mg/dL
Nitrite: NEGATIVE
Protein, ur: 100 mg/dL — AB
RBC / HPF: >50 RBC/hpf — ABNORMAL HIGH (ref 0–5)
Specific Gravity, Urine: >1.046 — ABNORMAL HIGH (ref 1.005–1.030)
WBC, UA: >50 WBC/hpf — ABNORMAL HIGH (ref 0–5)
pH: 5 (ref 5.0–8.0)

## 2021-02-27 LAB — COMPREHENSIVE METABOLIC PANEL
ALT: 10 U/L (ref 0–44)
AST: 15 U/L (ref 15–41)
Albumin: 2 g/dL — ABNORMAL LOW (ref 3.5–5.0)
Alkaline Phosphatase: 84 U/L (ref 38–126)
Anion gap: 9 (ref 5–15)
BUN: 14 mg/dL (ref 8–23)
CO2: 27 mmol/L (ref 22–32)
Calcium: 8.9 mg/dL (ref 8.9–10.3)
Chloride: 102 mmol/L (ref 98–111)
Creatinine, Ser: 0.76 mg/dL (ref 0.44–1.00)
GFR, Estimated: >60 mL/min (ref >60)
Glucose, Bld: 94 mg/dL (ref 70–99)
Potassium: 3.2 mmol/L — ABNORMAL LOW (ref 3.5–5.1)
Sodium: 138 mmol/L (ref 135–145)
Total Bilirubin: 0.7 mg/dL (ref 0.3–1.2)
Total Protein: 6.1 g/dL — ABNORMAL LOW (ref 6.5–8.1)

## 2021-02-27 LAB — POTASSIUM: Potassium: 3.8 mmol/L (ref 3.5–5.1)

## 2021-02-27 LAB — PROCALCITONIN: Procalcitonin: 0.19 ng/mL

## 2021-02-27 LAB — MAGNESIUM: Magnesium: 2 mg/dL (ref 1.7–2.4)

## 2021-02-27 LAB — PREPARE RBC (CROSSMATCH)

## 2021-02-27 LAB — ABO/RH: ABO/RH(D): A POS

## 2021-02-27 LAB — PHOSPHORUS: Phosphorus: 3 mg/dL (ref 2.5–4.6)

## 2021-02-27 LAB — MRSA PCR SCREENING: MRSA by PCR: POSITIVE — AB

## 2021-02-27 MED ORDER — LAMOTRIGINE 100 MG PO TABS
200.0000 mg | ORAL_TABLET | Freq: Every morning | ORAL | Status: DC
  Administered 2021-02-28 – 2021-03-09 (×9): 200 mg via ORAL
  Filled 2021-02-27 (×9): qty 2

## 2021-02-27 MED ORDER — POTASSIUM CHLORIDE CRYS ER 20 MEQ PO TBCR
40.0000 meq | EXTENDED_RELEASE_TABLET | Freq: Once | ORAL | Status: AC
  Administered 2021-02-27: 40 meq via ORAL
  Filled 2021-02-27: qty 2

## 2021-02-27 MED ORDER — ACETAMINOPHEN 325 MG PO TABS
650.0000 mg | ORAL_TABLET | Freq: Once | ORAL | Status: AC
  Administered 2021-02-27: 650 mg via ORAL
  Filled 2021-02-27: qty 2

## 2021-02-27 MED ORDER — ALPRAZOLAM 0.5 MG PO TABS
0.5000 mg | ORAL_TABLET | Freq: Every morning | ORAL | Status: DC
  Administered 2021-02-28 – 2021-03-09 (×9): 0.5 mg via ORAL
  Filled 2021-02-27 (×10): qty 1

## 2021-02-27 MED ORDER — SODIUM CHLORIDE 0.9 % IV SOLN
8.0000 mg/h | INTRAVENOUS | Status: DC
  Administered 2021-02-27 – 2021-03-01 (×6): 8 mg/h via INTRAVENOUS
  Filled 2021-02-27 (×8): qty 80

## 2021-02-27 MED ORDER — POLYETHYLENE GLYCOL 3350 17 G PO PACK
17.0000 g | PACK | ORAL | Status: DC
  Administered 2021-02-27 – 2021-03-09 (×4): 17 g via ORAL
  Filled 2021-02-27 (×4): qty 1

## 2021-02-27 MED ORDER — MUPIROCIN 2 % EX OINT
1.0000 "application " | TOPICAL_OINTMENT | Freq: Two times a day (BID) | CUTANEOUS | Status: AC
  Administered 2021-02-27 – 2021-03-03 (×7): 1 via NASAL
  Filled 2021-02-27 (×3): qty 22

## 2021-02-27 MED ORDER — CHLORHEXIDINE GLUCONATE CLOTH 2 % EX PADS
6.0000 | MEDICATED_PAD | Freq: Every day | CUTANEOUS | Status: AC
  Administered 2021-02-27 – 2021-03-02 (×4): 6 via TOPICAL

## 2021-02-27 MED ORDER — DIPHENHYDRAMINE HCL 25 MG PO CAPS
25.0000 mg | ORAL_CAPSULE | Freq: Once | ORAL | Status: AC
  Administered 2021-02-27: 25 mg via ORAL
  Filled 2021-02-27: qty 1

## 2021-02-27 MED ORDER — VANCOMYCIN HCL 1250 MG/250ML IV SOLN
1250.0000 mg | INTRAVENOUS | Status: DC
  Administered 2021-02-27 – 2021-03-02 (×4): 1250 mg via INTRAVENOUS
  Filled 2021-02-27 (×4): qty 250

## 2021-02-27 MED ORDER — QUETIAPINE FUMARATE 25 MG PO TABS
37.5000 mg | ORAL_TABLET | Freq: Every day | ORAL | Status: DC
  Administered 2021-02-27 – 2021-03-08 (×10): 37.5 mg via ORAL
  Filled 2021-02-27 (×11): qty 2

## 2021-02-27 MED ORDER — SODIUM CHLORIDE 0.9% IV SOLUTION: Freq: Once | INTRAVENOUS | Status: DC

## 2021-02-27 MED ORDER — PIPERACILLIN-TAZOBACTAM 3.375 G IVPB
3.3750 g | Freq: Three times a day (TID) | INTRAVENOUS | Status: DC
  Administered 2021-02-27 – 2021-03-09 (×31): 3.375 g via INTRAVENOUS
  Filled 2021-02-27 (×30): qty 50

## 2021-02-27 MED ORDER — FUROSEMIDE 10 MG/ML IJ SOLN
20.0000 mg | Freq: Once | INTRAMUSCULAR | Status: AC
  Administered 2021-02-27: 20 mg via INTRAVENOUS
  Filled 2021-02-27 (×2): qty 2

## 2021-02-27 MED ORDER — LACTATED RINGERS IV SOLN: INTRAVENOUS | Status: DC

## 2021-02-27 NOTE — Progress Notes (Signed)
Pt refused all of hot tray. This nurse ordered pt two sandwiches to see which she would eat. Pt ate 1/2 peanut butter sandwich for lunch.

## 2021-02-27 NOTE — H&P (Signed)
History and Physical    Karen Dyer NGE:[id number] DOB: 13-Aug-1941 DOA: 02/26/2021  PCP: No primary care provider on file.   Patient coming from: Skilled nursing facility via [facility] emergency room  Chief Complaint: Syncope, hypoxia  HPI: Karen Dyer is a 80 y.o. female with medical history significant for dementia, hypertension, diastolic CHF, GERD, osteoarthritis, anemia who was sent to [facility] emergency room after a syncopal event at a skilled nursing facility.  Patient was scheduled to have a blood transfusion at [facility] yesterday but had a syncopal event prior to going for the transfusion.  Is not clear why she was going to the transfusion.  Patient is unable to give any history.  There is no family present.  Right after the syncopal event patient was noted to be hypoxic with O2 sat in the 30s.  She was obtunded per EMS report initially but responded to an ammonia inhalant.  She had a negative CT of her head at [facility].  She had a significant elevated D-dimer of over 6000.  CT angiography of her chest abdomen pelvis was obtained.  She does not have any pulmonary embolism but she does have a large right-sided empyema.  She has a mildly elevated white blood cell count.  She was found to be Hemoccult positive.  Her hemoglobin was 7.4 at the emergency room. Transfer to [facility] as there are no specialty services of cardiothoracic surgery or interventional radiology at [facility]  Excepted the hospital service on cardiac telemetry floor  Review of Systems:  Unable to obtain secondary to dementia  History reviewed. No pertinent past medical history.  History reviewed. No pertinent surgical history.  Social History  reports previous alcohol use. She reports that she does not use drugs. No history on file for tobacco use.  Not on File  History reviewed. No pertinent family history.   Prior to Admission medications   Not on File     Physical Exam: Vitals:   02/26/21 2202  BP: 133/66  Pulse: 97  Resp: 20  Temp: 98.5 F (36.9 C)  TempSrc: Oral  SpO2: 96%  Weight: 69.7 kg  Height: 5\' 6"  (1.676 m)    Constitutional: NAD, calm, comfortable Vitals:   02/26/21 2202  BP: 133/66  Pulse: 97  Resp: 20  Temp: 98.5 F (36.9 C)  TempSrc: Oral  SpO2: 96%  Weight: 69.7 kg  Height: 5\' 6"  (1.676 m)   General: WDWN, Alert and oriented to self Eyes: EOMI, PERRL, conjunctivae pale pink-colored.  Sclera nonicteric HENT:  [place]/AT, external ears normal.  Nares patent without epistasis.  Mucous membranes are moist.  Neck: Soft, normal range of motion, supple, no masses, no thyromegaly.  Trachea midline Respiratory: Diminished breath sounds on the right with rhonchi.  No wheezing, no crackles. Normal respiratory effort. No accessory muscle use.  Cardiovascular: Regular rate and rhythm, no murmurs / rubs / gallops. No extremity edema. 1+ pedal pulses.  Abdomen: Soft, no tenderness, nondistended, no rebound or guarding.  No masses palpated. Bowel sounds normoactive Musculoskeletal: FROM. no cyanosis. Normal muscle tone.  Skin: Warm, dry, intact no rashes, lesions, ulcers. No induration Neurologic:  Normal speech. patella DTR +1 bilaterally. Strength 5/5 in all extremities.   Psychiatric: Dementia.  Normal mood.    Labs on Admission: I have personally reviewed following labs and imaging studies  CBC: No results for input(s): WBC, NEUTROABS, HGB, HCT, MCV, PLT in the last 168 hours.  Basic Metabolic Panel: No results for input(s):  NA, K, CL, CO2, GLUCOSE, BUN, CREATININE, CALCIUM, MG, PHOS in the last 168 hours.  GFR: CrCl cannot be calculated (No successful lab value found.).  Liver Function Tests: No results for input(s): AST, ALT, ALKPHOS, BILITOT, PROT, ALBUMIN in the last 168 hours.  Urine analysis: No results found for: COLORURINE, APPEARANCEUR, LABSPEC, PHURINE, GLUCOSEU, HGBUR, BILIRUBINUR, KETONESUR,  PROTEINUR, UROBILINOGEN, NITRITE, LEUKOCYTESUR  Radiological Exams on Admission: No results found.  EKG: Independently reviewed.  EKG report from [facility] shows sinus tachycardia with heart rate of 110 with no acute ST elevation or depression  Assessment/Plan Principal Problem:   Empyema  Karen Dyer is admitted to cardiac telemetry floor. Started on vancomycin and Zosyn for empiric antibiotic coverage.  Cording to the ER record patient was recently treated for pneumonia with an antibiotic but is unknown which when she took or when she finished it.  CT angiography showed no PE but did show large empyema measuring 15.2 cm in the posterior right lung.  Patient will need either IR or cardiothoracic surgery to evaluate for drainage. Check CBC, CMP  Active Problems:   Syncope She had a witnessed syncopal event at the nursing home and was then sent to the emergency room.  Should be kept on telemetry to monitor for any arrhythmia. Patient is unable to give any history secondary to her dementia    Anemia Reportedly patient was supposed to have an outpatient transfusion of packed red blood cells yesterday at [facility] but is not sure why is that was never documented in the record.  Her hemoglobin was found to be 7.3.  Recheck CBC now and if below 7 will provide packed red blood cell transfusion. She was found to be Hemoccult positive in the emergency room but she does not have any hematochezia or hematemesis.    GI bleed She was started on Protonix infusion at the emergency room prior to transfer.  This will be continued since she had Hemoccult positive stool and will need further work-up    Essential hypertension We will need to obtain her chronic medications from the skilled nursing facility Monitor blood pressure    Dementia  Chronic    Hypoxia Is reported the patient had desaturation after the syncopal event.  She is on supplemental oxygen to keep O2 sat between  92-96%.    DVT prophylaxis: SCDs for DVT prophylaxis. No anticoagulation secondary to heme positive stools and anemia Code Status:   Full code  Family Communication:  No family at bedside.  Disposition Plan:   Patient is from:  SNF  Anticipated DC to:  SNF  Anticipated DC date:  More than 2 midnight stay in the hospital  Anticipated DC barriers:   Admission status:  Inpatient  Yevonne Aline Rommie Dunn MD [facility]  How to contact the [facility] Attending or Consulting provider [place] or covering provider during after hours [place], for this patient?   1. Check the care team in [facility] and look for a) attending/consulting TRH provider listed and b) the [facility] team listed 2. Log into [email or web address] and use [place]'s universal password to access. If you do not have the password, please contact the hospital operator. 3. Locate the [facility] provider you are looking for under [facility] and page to a number that you can be directly reached. 4. If you still have difficulty reaching the provider, please page the [facility] (Director on Call) for the Hospitalists listed on amion for assistance.  02/27/2021, 1:22 AM

## 2021-02-27 NOTE — Progress Notes (Addendum)
Karen Dyer is a 80 y.o. female with medical history significant for dementia, hypertension, diastolic CHF, GERD, osteoarthritis, anemia who was sent to [facility] emergency room after a syncopal event at a skilled nursing facility.  Patient was scheduled to have a blood transfusion at [facility] yesterday but had a syncopal event prior to going for the transfusion.  Is not clear why she was going to the transfusion.  Patient is unable to give any history.  There is no family present.  Right after the syncopal event patient was noted to be hypoxic with O2 sat in the 30s.  She was obtunded per EMS report initially but responded to an ammonia inhalant.  She had a negative CT of her head at [facility].  She had a significant elevated D-dimer of over 6000.  CT angiography of her chest abdomen pelvis was obtained.  She does not have any pulmonary embolism but she does have a large right-sided empyema.  She has a mildly elevated white blood cell count.  She was found to be Hemoccult positive.  Her hemoglobin was 7.4 at the emergency room. Transfer to [facility] as there are no specialty services of cardiothoracic surgery or interventional radiology at [facility].  Excepted the hospital service on cardiac telemetry floor.  02/27/21: Patient was seen and examined at bedside.  On room air with O2 saturation in the mid 90s.  She is alert but confused in the state of dementia.  Updated her nephew who is also her medical power of attorney via phone on 02/27/2021.  All questions answered to the best of my ability.  Discussed case with interventional radiology and CTS.  Will make n.p.o. for possible procedure, fluid drain, from right sided empyema on 02/28/2021.  She presented with hemoglobin 6.5K, 2 unit PRBCs ordered by admitting physician to be transfused.  Will give IV Lasix 20 mg between units and continue to closely monitor on telemetry.    She is currently on broad-spectrum IV antibiotics  for her empyema, Zosyn and IV vancomycin.  MRSA screen positive on 02/27/2021.  Please refer to H&P dictated by my partner Dr. Tonie Griffith on 02/27/2021 for further details of the assessment and plan.

## 2021-02-27 NOTE — Progress Notes (Signed)
Date and time results received: 02/27/21 2:00  Test: Hemoglobin  Critical Value: 6.5  Name of Provider Notified: [place]  Orders Received

## 2021-02-27 NOTE — Progress Notes (Signed)
PT Cancellation Note  Patient Details Name: Karen Dyer MRN: [id number] DOB: [birth year]   Cancelled Treatment:    Reason Eval/Treat Not Completed: Medical issues which prohibited therapy per chart review, Hgb 6.5/HCT 21.4 and looks to be receiving transfusion today. Will monitor and attempt to return if she is medically ready later today and if time/schedule allow.    Windell Norfolk, DPT, PN1   Supplemental Physical Therapist [facility]    Pager [phone] Acute Rehab Office [phone]

## 2021-02-27 NOTE — Progress Notes (Signed)
Patient ID: Karen Dyer, female   DOB: 06-Apr-1941, 80 y.o.   MRN: [id number] TCTS  I reviewed her CTA of the chest from [facility] yesterday and it shows a solitary loculated right pleural fluid collection consistent with empyema. This is amenable to drainage with a pigtail by IR and given her advanced age, SNF residency with dementia and multiple other problems I think that is certainly the best and only option. That should drain it effectively. She is not a surgical candidate.

## 2021-02-27 NOTE — Progress Notes (Signed)
Called by RN and notified Hgb is 6.5.  Ordered 2 units PRBC for transfusion Will need GI consult in am.

## 2021-02-27 NOTE — Progress Notes (Signed)
Pharmacy Antibiotic Note  Krisandra Bueno is a 80 y.o. female admitted on 02/26/2021 with pneumonia and empyema.  Pharmacy has been consulted for Vancomycin dosing. WBC 12.8. Renal function good. Pt is a transfer from [facility]   Anti-biotics at [facility]:  Vancomycin 2000 mg IV x 1 at 1545 on 3/11 Zosyn 4.5g IV at ~1500 on 3/11 and Zosyn 3.375g  IV at 2009 on 3/11  Plan: Vancomycin 1250 mg IV q24h >>Estimated AUC: 455 Zosyn per MD Trend WBC, temp, renal function  F/U infectious work-up Drug levels as indicated   Height: 5\' 6"  (167.6 cm) Weight: 69.7 kg (153 lb 10.6 oz) IBW/kg (Calculated) : 59.3  Temp (24hrs), Avg:98.5 F (36.9 C), Min:98.5 F (36.9 C), Max:98.5 F (36.9 C)  Recent Labs  Lab 02/27/21 0130  WBC 12.8*  CREATININE 0.76    Estimated Creatinine Clearance: 51.6 mL/min (by C-G formula based on SCr of 0.76 mg/dL).    Not on [place], PharmD, [place] Pharmacist Phone: [phone]

## 2021-02-28 ENCOUNTER — Inpatient Hospital Stay (HOSPITAL_COMMUNITY): Payer: Medicare Other

## 2021-02-28 DIAGNOSIS — J869 Pyothorax without fistula: Secondary | ICD-10-CM | POA: Diagnosis not present

## 2021-02-28 LAB — BASIC METABOLIC PANEL
Anion gap: 7 (ref 5–15)
BUN: 9 mg/dL (ref 8–23)
CO2: 26 mmol/L (ref 22–32)
Calcium: 8.9 mg/dL (ref 8.9–10.3)
Chloride: 105 mmol/L (ref 98–111)
Creatinine, Ser: 0.68 mg/dL (ref 0.44–1.00)
GFR, Estimated: >60 mL/min (ref >60)
Glucose, Bld: 94 mg/dL (ref 70–99)
Potassium: 4.7 mmol/L (ref 3.5–5.1)
Sodium: 138 mmol/L (ref 135–145)

## 2021-02-28 LAB — CBC WITH DIFFERENTIAL/PLATELET
Abs Immature Granulocytes: 0.32 10*3/uL — ABNORMAL HIGH (ref 0.00–0.07)
Basophils Absolute: 0.1 10*3/uL (ref 0.0–0.1)
Basophils Relative: 1 %
Eosinophils Absolute: 0.4 10*3/uL (ref 0.0–0.5)
Eosinophils Relative: 3 %
HCT: 30.3 % — ABNORMAL LOW (ref 36.0–46.0)
Hemoglobin: 9.9 g/dL — ABNORMAL LOW (ref 12.0–15.0)
Immature Granulocytes: 2 %
Lymphocytes Relative: 18 %
Lymphs Abs: 2.3 10*3/uL (ref 0.7–4.0)
MCH: 24.8 pg — ABNORMAL LOW (ref 26.0–34.0)
MCHC: 32.7 g/dL (ref 30.0–36.0)
MCV: 75.8 fL — ABNORMAL LOW (ref 80.0–100.0)
Monocytes Absolute: 1.1 10*3/uL — ABNORMAL HIGH (ref 0.1–1.0)
Monocytes Relative: 9 %
Neutro Abs: 8.9 10*3/uL — ABNORMAL HIGH (ref 1.7–7.7)
Neutrophils Relative %: 67 %
Platelets: 625 10*3/uL — ABNORMAL HIGH (ref 150–400)
RBC: 4 MIL/uL (ref 3.87–5.11)
RDW: 19.4 % — ABNORMAL HIGH (ref 11.5–15.5)
WBC: 13.1 10*3/uL — ABNORMAL HIGH (ref 4.0–10.5)
nRBC: 0 % (ref 0.0–0.2)

## 2021-02-28 LAB — TYPE AND SCREEN
ABO/RH(D): A POS
Antibody Screen: NEGATIVE
Unit division: 0
Unit division: 0

## 2021-02-28 LAB — GLUCOSE, CAPILLARY
Glucose-Capillary: 97 mg/dL (ref 70–99)
Glucose-Capillary: 87 mg/dL (ref 70–99)
Glucose-Capillary: 87 mg/dL (ref 70–99)
Glucose-Capillary: 143 mg/dL — ABNORMAL HIGH (ref 70–99)

## 2021-02-28 LAB — BPAM RBC
Blood Product Expiration Date: 202204052359
Blood Product Expiration Date: 202204062359
ISSUE DATE / TIME: 202203121027
ISSUE DATE / TIME: 202203121653
Unit Type and Rh: 6200
Unit Type and Rh: 6200

## 2021-02-28 LAB — PHOSPHORUS: Phosphorus: 2.8 mg/dL (ref 2.5–4.6)

## 2021-02-28 LAB — MAGNESIUM: Magnesium: 2 mg/dL (ref 1.7–2.4)

## 2021-02-28 MED ORDER — MIDAZOLAM HCL 2 MG/2ML IJ SOLN
INTRAMUSCULAR | Status: AC | PRN
  Administered 2021-02-28: 0.5 mg via INTRAVENOUS

## 2021-02-28 MED ORDER — LIDOCAINE HCL 1 % IJ SOLN
INTRAMUSCULAR | Status: AC
  Filled 2021-02-28: qty 20

## 2021-02-28 MED ORDER — MIDAZOLAM HCL 2 MG/2ML IJ SOLN
INTRAMUSCULAR | Status: AC
  Filled 2021-02-28: qty 2

## 2021-02-28 MED ORDER — TRAMADOL HCL 50 MG PO TABS
50.0000 mg | ORAL_TABLET | Freq: Four times a day (QID) | ORAL | Status: DC | PRN
  Administered 2021-02-28 – 2021-03-08 (×6): 50 mg via ORAL
  Filled 2021-02-28 (×6): qty 1

## 2021-02-28 MED ORDER — FENTANYL CITRATE (PF) 100 MCG/2ML IJ SOLN
INTRAMUSCULAR | Status: AC
  Filled 2021-02-28: qty 2

## 2021-02-28 MED ORDER — FENTANYL CITRATE (PF) 100 MCG/2ML IJ SOLN
INTRAMUSCULAR | Status: AC | PRN
  Administered 2021-02-28: 25 ug via INTRAVENOUS

## 2021-02-28 MED ORDER — ACETAMINOPHEN 325 MG PO TABS
650.0000 mg | ORAL_TABLET | Freq: Four times a day (QID) | ORAL | Status: DC | PRN
  Administered 2021-03-01 – 2021-03-08 (×6): 650 mg via ORAL
  Filled 2021-02-28 (×6): qty 2

## 2021-02-28 NOTE — Progress Notes (Addendum)
SLP Cancellation Note  Patient Details Name: Karen Dyer MRN: [id number] DOB: 1941/09/11   Cancelled treatment:       Reason Eval/Treat Not Completed: Medical issues which prohibited therapy (Pt's RN, Amrah, reported that the pt is currently NPO for chest tube placement and she was unsure of when this will occur. SLP will follow up as schedule allows.)  Denice Cardon I. Hardin Negus, [place], [place] Office number [phone] Pager [place] 02/28/2021, 9:12 AM

## 2021-02-28 NOTE — Plan of Care (Signed)

## 2021-02-28 NOTE — Procedures (Signed)
Interventional Radiology Procedure Note  Procedure: Placement of a 72F pigtail thoracostomy tube into right sided empyema with evacuation of 65 mL foul smelling purulent fluid.  Sample sent for Cx.   Complications: None  Estimated Blood Loss: None  Recommendations: - Drain to LWS at - 20 cm H2O - Cultures pending   Signed,  Criselda Peaches, MD

## 2021-02-28 NOTE — Progress Notes (Signed)
PROGRESS NOTE  Karen Dyer YKD:[id number] DOB: 04-05-1941 DOA: 02/26/2021 PCP: No primary care provider on file.  HPI/Recap of past 24 hours: [employer] a 80 y.o.femalewith medical history significant fordementia, hypertension, diastolic CHF, GERD, osteoarthritis, anemia who was sent to [facility] emergency room after a syncopal event at a skilled nursing facility. Patient was scheduled to have a blood transfusion at [facility] yesterday but had a syncopal event prior to going for the transfusion. Is not clear why she was going to the transfusion. Patient is unable to give any history. There is no family present. Right after the syncopal event patient was noted to be hypoxic with O2 sat in the 30s. She was obtunded per EMS report initially but responded to an ammonia inhalant. She had a negative CT of her head at [facility]. She had a significant elevated D-dimer of over 6000. CT angiography of her chest abdomen pelvis was obtained. She does not have any pulmonary embolism but she does have a large right-sided empyema. She has a mildly elevated white blood cell count. She was found to be Hemoccult positive. Her hemoglobin was 7.4at the emergency room. Transfer to [facility] as there are no specialty services of cardiothoracic surgery or interventional radiology at [facility].  Discussed case with interventional radiology and CTS.  She is not a surgical candidate.  Plan for right-sided pleural drain placement by interventional radiology on 02/28/2021.    She has received unit 2 unit PRBCs on 02/27/2021 for hemoglobin of 6.5K.  She is currently on broad-spectrum IV antibiotics Zosyn and IV vancomycin.  MRSA screen positive on 02/27/2021.  02/28/21: Patient was seen and examined at her bedside.  She is alert, her mood is irritable.  Refuses to participate in interview.  Assessment/Plan: Principal Problem:   Empyema ([place]) Active Problems:   Syncope    Anemia   GI bleed   Dementia ([place])   Essential hypertension   Hypoxia  Right-sided empyema, POA Transferred from [facility] emergency room  Discussed case with interventional radiology and CTS.  She is not a surgical candidate.   Plan for right-sided pleural drain placement by interventional radiology on 02/28/2021.   She is currently on broad-spectrum IV antibiotics Zosyn and IV vancomycin.  MRSA screen positive on 02/27/2021. Rule out dysphagia with speech therapist evaluation. Aspiration precautions  Presumptive UTI, POA UA positive for pyuria Urine culture pending. She is currently on broad-spectrum IV antibiotics for her right-sided empyema which will cover for her presumptive UTI.  Syncope, unclear etiology She had a witnessed syncopal event at the nursing home prior to presenting to the ED. Patient is unable to provide a history due to underlying dementia. Orthostatic vital signs positive. Continue to monitor on cardiac telemetry. Continue fall precautions  Acute blood loss anemia She has received unit 2 unit PRBCs on 02/27/2021 for hemoglobin of 6.5K.   Hemoglobin 9.9 this morning, MCV 75.  Physical debility PT OT to assess for she is hemodynamically stable Fall precautions.    Code Status: Full code.  Family Communication: Updated her nephew via phone on 02/27/2021.  Disposition Plan: Likely will return to SNF once interventional radiologist signs off.   Consultants:  Interventional radiology.  Procedures:  Planned right sided chest tube placement on 02/28/2021 by interventional radiology.  Antimicrobials:  IV vancomycin  IV Zosyn  DVT prophylaxis: SCDs.  Chemical DVT prophylaxis held due to anemia.  Status is: Inpatient    Dispo:  Patient From: [place]  Planned Disposition:  [place]  Anticipated discharge date 03/02/2021.  Medically stable for discharge: No, ongoing management of right-sided empyema and  presumptive UTI.          Objective: Vitals:   02/27/21 1726 02/27/21 1953 02/27/21 2228 02/28/21 0956  BP: (!) 127/54 128/61 (!) 137/50   Pulse: 92 99 92 (!) 116  Resp: 18 18 18    Temp: 99.5 F (37.5 C) 98.2 F (36.8 C) 98.5 F (36.9 C)   TempSrc: Oral Oral Oral   SpO2: 94% 96% 96% 94%  Weight:      Height:        Intake/Output Summary (Last 24 hours) at 02/28/2021 1044 Last data filed at 02/28/2021 0600 Gross per 24 hour  Intake 1251.89 ml  Output 1200 ml  Net 51.89 ml   Filed Weights   02/26/21 2202  Weight: 69.7 kg    Exam:  . General: 80 y.o. year-old female well developed well nourished in no acute distress.  Alert and confused, irritable in the setting of dementia. . Cardiovascular: Regular rate and rhythm with no rubs or gallops.  No thyromegaly or JVD noted.   Marland Kitchen Respiratory: Decreased right sided breath sounds. . Abdomen: Soft nontender nondistended with normal bowel sounds x4 quadrants. . Musculoskeletal: No lower extremity edema bilaterally. . Skin: No ulcerative lesions noted or rashes. . Psychiatry: Mood is appropriate for condition and setting   Data Reviewed: CBC: Recent Labs  Lab 02/27/21 0130 02/28/21 0648  WBC 12.8* 13.1*  NEUTROABS 9.2* 8.9*  HGB 6.5* 9.9*  HCT 21.4* 30.3*  MCV 72.8* 75.8*  PLT 634* 470*   Basic Metabolic Panel: Recent Labs  Lab 02/27/21 0130 02/27/21 1637 02/28/21 0648  NA 138  --  138  K 3.2* 3.8 4.7  CL 102  --  105  CO2 27  --  26  GLUCOSE 94  --  94  BUN 14  --  9  CREATININE 0.76  --  0.68  CALCIUM 8.9  --  8.9  MG  --  2.0 2.0  PHOS  --  3.0 2.8   GFR: Estimated Creatinine Clearance: 51.6 mL/min (by C-G formula based on SCr of 0.68 mg/dL). Liver Function Tests: Recent Labs  Lab 02/27/21 0130  AST 15  ALT 10  ALKPHOS 84  BILITOT 0.7  PROT 6.1*  ALBUMIN 2.0*   No results for input(s): LIPASE, AMYLASE in the last 168 hours. No results for input(s): AMMONIA in the last 168  hours. Coagulation Profile: No results for input(s): INR, PROTIME in the last 168 hours. Cardiac Enzymes: No results for input(s): CKTOTAL, CKMB, CKMBINDEX, TROPONINI in the last 168 hours. BNP (last 3 results) No results for input(s): PROBNP in the last 8760 hours. HbA1C: No results for input(s): HGBA1C in the last 72 hours. CBG: Recent Labs  Lab 02/28/21 0847  GLUCAP 97   Lipid Profile: No results for input(s): CHOL, HDL, LDLCALC, TRIG, CHOLHDL, LDLDIRECT in the last 72 hours. Thyroid Function Tests: No results for input(s): TSH, T4TOTAL, FREET4, T3FREE, THYROIDAB in the last 72 hours. Anemia Panel: No results for input(s): VITAMINB12, FOLATE, FERRITIN, TIBC, IRON, RETICCTPCT in the last 72 hours. Urine analysis:    Component Value Date/Time   COLORURINE YELLOW 02/27/2021 0601   APPEARANCEUR HAZY (A) 02/27/2021 0601   LABSPEC >1.046 (H) 02/27/2021 0601   PHURINE 5.0 02/27/2021 0601   GLUCOSEU NEGATIVE 02/27/2021 0601   HGBUR MODERATE (A) 02/27/2021 0601   BILIRUBINUR NEGATIVE 02/27/2021 0601   KETONESUR NEGATIVE  02/27/2021 0601   PROTEINUR 100 (A) 02/27/2021 0601   NITRITE NEGATIVE 02/27/2021 0601   LEUKOCYTESUR LARGE (A) 02/27/2021 0601   Sepsis Labs: @LABRCNTIP (procalcitonin:4,lacticidven:4)  ) Recent Results (from the past 240 hour(s))  MRSA PCR Screening     Status: Abnormal   Collection Time: 02/27/21  1:25 AM   Specimen: Nasal Mucosa; Nasopharyngeal  Result Value Ref Range Status   MRSA by PCR POSITIVE (A) NEGATIVE Final    Comment: CRITICAL RESULT CALLED TO, READ BACK BY AND VERIFIED WITH: RNTamala Ser [id number] @0448  THANEY Performed at [place] [address], [place], [place] [address]       Studies: No results found.  Scheduled Meds: . sodium chloride   Intravenous Once  . ALPRAZolam  0.5 mg Oral q morning  . Chlorhexidine Gluconate Cloth  6 each Topical Q0600  . lamoTRIgine  200 mg Oral q morning  . mupirocin ointment  1 application  Nasal BID  . polyethylene glycol  17 g Oral QODAY  . QUEtiapine  37.5 mg Oral QHS    Continuous Infusions: . pantoprozole (PROTONIX) infusion 8 mg/hr (02/27/21 2345)  . piperacillin-tazobactam (ZOSYN)  IV 3.375 g (02/28/21 0535)  . vancomycin 1,250 mg (02/27/21 2147)     LOS: 2 days     Kayleen Memos, MD [facility] Pager [phone]  If 7PM-7AM, please contact night-coverage [email or web address] Password [facility] 02/28/2021, 10:44 AM

## 2021-02-28 NOTE — Progress Notes (Signed)
Chief Complaint: Patient was seen in consultation today for right chest empyema  Referring Physician(s): Dr. Irene Pap  Supervising Physician: Jacqulynn Cadet  Patient Status: [facility] - In-pt  History of Present Illness: Karen Dyer is a 80 y.o. female found to have right chest empyema on CT done at outside facility. She was admitted to [facility] for anemia from skilled facility. Stable from that but IR is asked to eval for perc chest drain. CTS consulted and agrees chest drain is appropriate given comorbidities. PMHx, meds, labs, imaging, allergies reviewed. Pt with dementia. Will converse but she does not understand or remember anything. Has been NPO today as directed.    History reviewed. No pertinent past medical history.  History reviewed. No pertinent surgical history.  Allergies: Aspirin and Other  Medications:  Current Facility-Administered Medications:  .  0.9 %  sodium chloride infusion (Manually program via Guardrails IV Fluids), , Intravenous, Once, Chotiner, Yevonne Aline, MD .  ALPRAZolam Duanne Moron) tablet 0.5 mg, 0.5 mg, Oral, q morning, Hall, Carole N, DO, 0.5 mg at 02/28/21 0842 .  Chlorhexidine Gluconate Cloth 2 % PADS 6 each, 6 each, Topical, Q0600, Chotiner, Yevonne Aline, MD, 6 each at 02/28/21 0537 .  lamoTRIgine (LAMICTAL) tablet 200 mg, 200 mg, Oral, q morning, Hall, Carole N, DO, 200 mg at 02/28/21 0843 .  mupirocin ointment (BACTROBAN) 2 % 1 application, 1 application, Nasal, BID, Chotiner, Yevonne Aline, MD, 1 application at 96/04/54 0844 .  pantoprazole (PROTONIX) 80 mg in sodium chloride 0.9 % 100 mL (0.8 mg/mL) infusion, 8 mg/hr, Intravenous, Continuous, Chotiner, Yevonne Aline, MD, Last Rate: 10 mL/hr at 02/27/21 2345, 8 mg/hr at 02/27/21 2345 .  piperacillin-tazobactam (ZOSYN) IVPB 3.375 g, 3.375 g, Intravenous, Q8H, Chotiner, Yevonne Aline, MD, Last Rate: 12.5 mL/hr at 02/28/21 0535, 3.375 g at 02/28/21 0535 .  polyethylene glycol (MIRALAX / GLYCOLAX) packet 17 g, 17  g, Oral, QODAY, Hall, Carole N, DO, 17 g at 02/27/21 2153 .  QUEtiapine (SEROQUEL) tablet 37.5 mg, 37.5 mg, Oral, QHS, Hall, Carole N, DO, 37.5 mg at 02/27/21 2159 .  vancomycin (VANCOREADY) IVPB 1250 mg/250 mL, 1,250 mg, Intravenous, Q24H, Erenest Blank, RPH, Last Rate: 166.7 mL/hr at 02/27/21 2147, 1,250 mg at 02/27/21 2147    History reviewed. No pertinent family history.  Social History   Socioeconomic History  . Marital status: Not on file    Spouse name: Not on file  . Number of children: Not on file  . Years of education: Not on file  . Highest education level: Not on file  Occupational History  . Not on file  Tobacco Use  . Smoking status: Unknown If Ever Smoked  . Smokeless tobacco: Not on file  Vaping Use  . Vaping Use: Never used  Substance and Sexual Activity  . Alcohol use: Not Currently  . Drug use: Never  . Sexual activity: Not Currently  Other Topics Concern  . Not on file  Social History Narrative  . Not on file   Social Determinants of Health   Financial Resource Strain: Not on file  Food Insecurity: Not on file  Transportation Needs: Not on file  Physical Activity: Not on file  Stress: Not on file  Social Connections: Not on file     Review of Systems: A 12 point ROS discussed and pertinent positives are indicated in the HPI above.  All other systems are negative.  Review of Systems  Vital Signs: BP (!) 137/50 (BP Location: Left Arm)  Pulse (!) 116   Temp 98.5 F (36.9 C) (Oral)   Resp 18   Ht 5\' 6"  (1.676 m)   Wt 69.7 kg   SpO2 94%   BMI 24.80 kg/m   Physical Exam Constitutional:      General: She is not in acute distress.    Appearance: She is not ill-appearing.  HENT:     Mouth/Throat:     Mouth: Mucous membranes are moist.     Pharynx: Oropharynx is clear.  Cardiovascular:     Rate and Rhythm: Normal rate and regular rhythm.     Heart sounds: Normal heart sounds.  Pulmonary:     Effort: Pulmonary effort is normal. No  respiratory distress.     Comments: Decreased right BS     Imaging: DG CHEST PORT 1 VIEW  Result Date: 02/27/2021 CLINICAL DATA:  Empyema EXAM: PORTABLE CHEST 1 VIEW COMPARISON:  February 26, 2021 chest radiograph and chest CT FINDINGS: Consolidation with effusion is again noted in the lateral right base region. The focal empyema seen on CT is not well delineated by radiography. Lungs elsewhere clear. Heart is upper normal in size with pulmonary vascularity normal. No adenopathy. No bone lesions. IMPRESSION: Fluid and consolidation lateral right base persists. No new opacity evident. Stable cardiac silhouette. Electronically Signed   By: Lowella Grip III M.D.   On: 02/27/2021 12:31    Labs:  CBC: Recent Labs    02/27/21 0130 02/28/21 0648  WBC 12.8* 13.1*  HGB 6.5* 9.9*  HCT 21.4* 30.3*  PLT 634* 625*    COAGS: No results for input(s): INR, APTT in the last 8760 hours.  BMP: Recent Labs    02/27/21 0130 02/27/21 1637 02/28/21 0648  NA 138  --  138  K 3.2* 3.8 4.7  CL 102  --  105  CO2 27  --  26  GLUCOSE 94  --  94  BUN 14  --  9  CALCIUM 8.9  --  8.9  CREATININE 0.76  --  0.68  GFRNONAA >60  --  >60    LIVER FUNCTION TESTS: Recent Labs    02/27/21 0130  BILITOT 0.7  AST 15  ALT 10  ALKPHOS 84  PROT 6.1*  ALBUMIN 2.0*    TUMOR MARKERS: No results for input(s): AFPTM, CEA, CA199, CHROMGRNA in the last 8760 hours.  Assessment and Plan: Right chest empyema For CT guided chest drain Labs reviewed. Risks and benefits discussed with the patient and her nephew including bleeding, infection, damage to adjacent structures, bowel perforation/fistula connection, and sepsis.  All questions were answered, agreeable to proceed. Consent signed and in chart.    Thank you for this interesting consult.  I greatly enjoyed meeting Belvia Gotschall and look forward to participating in their care.  A copy of this report was sent to the requesting provider on this  date.  Electronically Signed: Ascencion Dike, PA-C 02/28/2021, 10:34 AM   I spent a total of 20 minutes in face to face in clinical consultation, greater than 50% of which was counseling/coordinating care for right chest empyema

## 2021-02-28 NOTE — Evaluation (Signed)
Physical Therapy Evaluation Patient Details Name: Karen Dyer MRN: [id number] DOB: 1941-02-24 Today's Date: 02/28/2021   History of Present Illness  80 yo admitted 3/11 from SNF with syncopal episode. Pt with Rt empyema and anemia. PMhx: dementia, HTN, dCHF, GERD, osteoarthritis, anemia  Clinical Impression  Pt pleasant and not oriented but able to following commands throughout session. Pt able to state need to sit with initial gait with noted orthostatic drop with initial stand but recovery with 3 min standing. Pt from SNF and anticipate near baseline but no caregivers present to confirm. Pt with decreased activity tolerance, cognition and cardiopulmonary function who will benefit from initial acute therapy to maximize mobility prior to return to SNF. Encouraged OOB daily with staff assist.   Sitting 149/70 (94) Standing initial 125/64 (83) Standing 3 min 146/70 (91) HR 116-122 SpO2 94% RA    Follow Up Recommendations SNF    Equipment Recommendations  None recommended by PT    Recommendations for Other Services       Precautions / Restrictions Precautions Precautions: Fall Precaution Comments: orthostatic with initial stand      Mobility  Bed Mobility Overal bed mobility: Needs Assistance Bed Mobility: Supine to Sit     Supine to sit: Supervision     General bed mobility comments: supervision for lines with cues to initiate transfer    Transfers Overall transfer level: Needs assistance   Transfers: Sit to/from Stand Sit to Stand: Min guard         General transfer comment: guarding for safety and lines with pt able to stand from bed and walk 10' then report dizziness. BP checked in sitting and standing with initial drop from sitting to standing which recovered with static 3 min stand  Ambulation/Gait Ambulation/Gait assistance: Min assist Gait Distance (Feet): 90 Feet Assistive device: Rolling walker (2 wheeled) Gait Pattern/deviations: Step-through  pattern;Decreased stride length;Trunk flexed   Gait velocity interpretation: 1.31 - 2.62 ft/sec, indicative of limited community ambulator General Gait Details: assist to achieve proximity to RW and for turning, cues for direction and safety as well as Product/process development scientist    Modified Rankin (Stroke Patients Only)       Balance Overall balance assessment: Needs assistance   Sitting balance-Leahy Scale: Good Sitting balance - Comments: EOB without UE support   Standing balance support: Bilateral upper extremity supported Standing balance-Leahy Scale: Poor Standing balance comment: bil UE support on RW for standing                             Pertinent Vitals/Pain Pain Assessment: No/denies pain    Home Living Family/patient expects to be discharged to:: Skilled nursing facility                      Prior Function Level of Independence: Needs assistance   Gait / Transfers Assistance Needed: pt reports assist with RW for all gait but walks in hallway  ADL's / Homemaking Assistance Needed: staff assist for ADL adn iADL        Hand Dominance        Extremity/Trunk Assessment   Upper Extremity Assessment Upper Extremity Assessment: Generalized weakness    Lower Extremity Assessment Lower Extremity Assessment: Generalized weakness    Cervical / Trunk Assessment Cervical / Trunk Assessment: Kyphotic  Communication   Communication: No difficulties  Cognition Arousal/Alertness:  Awake/alert Behavior During Therapy: WFL for tasks assessed/performed Overall Cognitive Status: No family/caregiver present to determine baseline cognitive functioning                                 General Comments: pt with history of dementia. Oriented to self and followign commands but perseverating on being awoken at 10pm      General Comments      Exercises     Assessment/Plan    PT Assessment Patient  needs continued PT services  PT Problem List Decreased mobility;Decreased activity tolerance;Decreased cognition;Decreased balance;Decreased knowledge of use of DME       PT Treatment Interventions Gait training;Balance training;Functional mobility training;Therapeutic activities;Patient/family education;Therapeutic exercise;DME instruction    PT Goals (Current goals can be found in the Care Plan section)  Acute Rehab PT Goals Patient Stated Goal: return home and sleep PT Goal Formulation: With patient Time For Goal Achievement: 03/14/21 Potential to Achieve Goals: Fair    Frequency Min 2X/week   Barriers to discharge        Co-evaluation               AM-PAC PT "6 Clicks" Mobility  Outcome Measure Help needed turning from your back to your side while in a flat bed without using bedrails?: A Little Help needed moving from lying on your back to sitting on the side of a flat bed without using bedrails?: A Little Help needed moving to and from a bed to a chair (including a wheelchair)?: A Little Help needed standing up from a chair using your arms (e.g., wheelchair or bedside chair)?: A Little Help needed to walk in hospital room?: A Little Help needed climbing 3-5 steps with a railing? : A Lot 6 Click Score: 17    End of Session Equipment Utilized During Treatment: Gait belt Activity Tolerance: Patient tolerated treatment well Patient left: in chair;with call bell/phone within reach;with chair alarm set Nurse Communication: Mobility status PT Visit Diagnosis: Other abnormalities of gait and mobility (R26.89);Difficulty in walking, not elsewhere classified (R26.2)    Time: 7262-0355 PT Time Calculation (min) (ACUTE ONLY): 22 min   Charges:   PT Evaluation $PT Eval Moderate Complexity: 1 Mod          Jocilynn Grade P, PT Acute Rehabilitation Services Pager: [phone] Office: [phone]   Keanna Tugwell B Diar Berkel 02/28/2021, 10:01 AM

## 2021-02-28 NOTE — Plan of Care (Signed)
  Problem: Coping: Goal: Level of anxiety will decrease 02/28/2021 2313 by Brooke Bonito, RN Outcome: Progressing 02/28/2021 2312 by Brooke Bonito, RN Outcome: Progressing   Problem: Pain Managment: Goal: General experience of comfort will improve 02/28/2021 2313 by Brooke Bonito, RN Outcome: Progressing 02/28/2021 2312 by Brooke Bonito, RN Outcome: Progressing   Problem: Safety: Goal: Ability to remain free from injury will improve 02/28/2021 2313 by Brooke Bonito, RN Outcome: Progressing 02/28/2021 2312 by Brooke Bonito, RN Outcome: Progressing   Problem: Skin Integrity: Goal: Risk for impaired skin integrity will decrease 02/28/2021 2313 by Brooke Bonito, RN Outcome: Progressing 02/28/2021 [place] by Brooke Bonito, RN Outcome: Progressing

## 2021-03-01 ENCOUNTER — Inpatient Hospital Stay (HOSPITAL_COMMUNITY): Payer: Medicare Other

## 2021-03-01 DIAGNOSIS — J869 Pyothorax without fistula: Secondary | ICD-10-CM | POA: Diagnosis not present

## 2021-03-01 LAB — CBC
HCT: 29.6 % — ABNORMAL LOW (ref 36.0–46.0)
Hemoglobin: 9.6 g/dL — ABNORMAL LOW (ref 12.0–15.0)
MCH: 24.9 pg — ABNORMAL LOW (ref 26.0–34.0)
MCHC: 32.4 g/dL (ref 30.0–36.0)
MCV: 76.7 fL — ABNORMAL LOW (ref 80.0–100.0)
Platelets: 528 10*3/uL — ABNORMAL HIGH (ref 150–400)
RBC: 3.86 MIL/uL — ABNORMAL LOW (ref 3.87–5.11)
RDW: 20.3 % — ABNORMAL HIGH (ref 11.5–15.5)
WBC: 10.1 10*3/uL (ref 4.0–10.5)
nRBC: 0 % (ref 0.0–0.2)

## 2021-03-01 LAB — BASIC METABOLIC PANEL
Anion gap: 7 (ref 5–15)
BUN: 12 mg/dL (ref 8–23)
CO2: 25 mmol/L (ref 22–32)
Calcium: 8.5 mg/dL — ABNORMAL LOW (ref 8.9–10.3)
Chloride: 106 mmol/L (ref 98–111)
Creatinine, Ser: 0.7 mg/dL (ref 0.44–1.00)
GFR, Estimated: >60 mL/min (ref >60)
Glucose, Bld: 95 mg/dL (ref 70–99)
Potassium: 3.6 mmol/L (ref 3.5–5.1)
Sodium: 138 mmol/L (ref 135–145)

## 2021-03-01 LAB — GLUCOSE, CAPILLARY
Glucose-Capillary: 88 mg/dL (ref 70–99)
Glucose-Capillary: 107 mg/dL — ABNORMAL HIGH (ref 70–99)

## 2021-03-01 MED ORDER — PANTOPRAZOLE SODIUM 40 MG PO TBEC
40.0000 mg | DELAYED_RELEASE_TABLET | Freq: Two times a day (BID) | ORAL | Status: DC
  Administered 2021-03-01 – 2021-03-09 (×16): 40 mg via ORAL
  Filled 2021-03-01 (×16): qty 1

## 2021-03-01 NOTE — Progress Notes (Addendum)
OT Cancellation Note  Patient Details Name: Reene Harlacher MRN: [id number] DOB: 08/27/1941   Cancelled Treatment:    Reason Eval/Treat Not Completed: Patient not medically ready (Pt stating "Get your a** out of here. Out! Out!" OTR will re-attempt as schedule allows.)  Re- attempted at 3:45p and pt continues to swat OTR away and state "get out." OTR playing soft music and attempting to assist pt with grooming, but pt did not allow it. OT to continue to follow.   Jefferey Pica, OTR/L Acute Rehabilitation Services Pager: [phone] Office: [phone]' ALLISON C 03/01/2021, 11:58 AM

## 2021-03-01 NOTE — Progress Notes (Signed)
Referring Physician(s): Kayleen Memos [facility])  Supervising Physician: Arne Cleveland  Patient Status:  [facility] - In-pt  Chief Complaint: "Leave me alone"  Subjective:  Right empyema s/p right chest tube placement in IR 02/28/2021. Patient laying in bed resting comfortably. She responds to voice and wishes not to be disturbed, repeatedly says "leave me alone". Unable to assess right chest tube insertion site secondary to patient compliance. On RA.   Allergies: Aspirin and Other  Medications: Prior to Admission medications   Medication Sig Start Date End Date Taking? Authorizing Provider  acetaminophen (TYLENOL) 500 MG tablet Take 1,000 mg by mouth every 12 (twelve) hours.   Yes [provider]  ALPRAZolam Duanne Moron) 0.5 MG tablet Take 0.5 mg by mouth every morning.   Yes [provider]  Cholecalciferol (VITAMIN D3) 1.25 MG (50000 UT) CAPS Take 50,000 Units by mouth every Monday.   Yes [provider]  lamoTRIgine (LAMICTAL) 200 MG tablet Take 200 mg by mouth every morning.   Yes [provider]  melatonin 5 MG TABS Take 5 mg by mouth at bedtime.   Yes [provider]  Nutritional Supplements (BOOST VERY HIGH CALORIE) LIQD Take 1 Can by mouth 2 (two) times daily.   Yes [provider]  ondansetron (ZOFRAN) 4 MG tablet Take 4 mg by mouth every 6 (six) hours as needed for nausea or vomiting.   Yes [provider]  oxyCODONE (OXY IR/ROXICODONE) 5 MG immediate release tablet Take 5 mg by mouth every 4 (four) hours as needed (pain).   Yes [provider]  pantoprazole (PROTONIX) 40 MG tablet Take 40 mg by mouth 2 (two) times daily.   Yes [provider]  polyethylene glycol (MIRALAX / GLYCOLAX) 17 g packet Take 17 g by mouth every other day.   Yes [provider]  QUEtiapine (SEROQUEL) 25 MG tablet Take 37.5 mg by mouth at bedtime.   Yes [provider]  amoxicillin-clavulanate (AUGMENTIN)  875-125 MG tablet Take 1 tablet by mouth 2 (two) times daily.    [provider]  levofloxacin (LEVAQUIN) 750 MG tablet Take 750 mg by mouth at bedtime.    [provider]     Vital Signs: BP 119/62 (BP Location: Left Arm)   Pulse 90   Temp 98 F (36.7 C)   Resp 18   Ht 5\' 6"  (1.676 m)   Wt 153 lb 10.6 oz (69.7 kg)   SpO2 97%   BMI 24.80 kg/m   Physical Exam Vitals and nursing note reviewed.  Constitutional:      General: She is not in acute distress. Pulmonary:     Effort: Pulmonary effort is normal. No respiratory distress.     Comments: On RA. Unable to assess for air leak or assess chest tube insertion site secondary to patient compliance. Right chest tube with approximately 100 cc purulent fluid in pleure-vac, tube to suction. Skin:    General: Skin is warm and dry.     Imaging: DG CHEST PORT 1 VIEW  Result Date: 02/27/2021 CLINICAL DATA:  Empyema EXAM: PORTABLE CHEST 1 VIEW COMPARISON:  February 26, 2021 chest radiograph and chest CT FINDINGS: Consolidation with effusion is again noted in the lateral right base region. The focal empyema seen on CT is not well delineated by radiography. Lungs elsewhere clear. Heart is upper normal in size with pulmonary vascularity normal. No adenopathy. No bone lesions. IMPRESSION: Fluid and consolidation lateral right base persists. No new opacity evident.  Stable cardiac silhouette. Electronically Signed   By: Lowella Grip III M.D.   On: 02/27/2021 12:31   CT IMAGE GUIDED FLUID DRAIN BY CATHETER  Result Date: 02/28/2021 Criselda Peaches, MD     02/28/2021 12:39 PM Interventional Radiology Procedure Note Procedure: Placement of a 89F pigtail thoracostomy tube into right sided empyema with evacuation of 65 mL foul smelling purulent fluid.  Sample sent for Cx. Complications: None Estimated Blood Loss: None Recommendations: - Drain to LWS at - 20 cm H2O - Cultures pending Signed, Criselda Peaches, MD     Labs:  CBC: Recent Labs    02/27/21 0130 02/28/21 0648 03/01/21 0632  WBC 12.8* 13.1* 10.1  HGB 6.5* 9.9* 9.6*  HCT 21.4* 30.3* 29.6*  PLT 634* 625* 528*    COAGS: No results for input(s): INR, APTT in the last 8760 hours.  BMP: Recent Labs    02/27/21 0130 02/27/21 1637 02/28/21 0648 03/01/21 0632  NA 138  --  138 138  K 3.2* 3.8 4.7 3.6  CL 102  --  105 106  CO2 27  --  26 25  GLUCOSE 94  --  94 95  BUN 14  --  9 12  CALCIUM 8.9  --  8.9 8.5*  CREATININE 0.76  --  0.68 0.70  GFRNONAA >60  --  >60 >60    LIVER FUNCTION TESTS: Recent Labs    02/27/21 0130  BILITOT 0.7  AST 15  ALT 10  ALKPHOS 84  PROT 6.1*  ALBUMIN 2.0*    Assessment and Plan:  Right empyema s/p right chest tube placement in IR 02/28/2021. Right chest tube stable with approximately 100 cc purulent fluid in pleure-vac, tube to suction (unable to assess for air leak or assess chest tube insertion site secondary to patient compliance). Continue current chest tube management- tube to remain to suction, continue with serial imaging, further chest tube management per TCTS. Further plans per TRH/TCTS- appreciate and agree with management. IR will continue to follow peripherally, please call IR with questions/concerns.   Electronically Signed: Earley Abide, PA-C 03/01/2021, 10:18 AM   I spent a total of 15 Minutes at the the patient's bedside AND on the patient's hospital floor or unit, greater than 50% of which was counseling/coordinating care for right empyema s/p right chest tube placement.

## 2021-03-01 NOTE — Progress Notes (Signed)
Patient would not allow nurse tech to check CBG

## 2021-03-01 NOTE — Progress Notes (Signed)
SLP Cancellation Note  Patient Details Name: Karen Dyer MRN: [id number] DOB: 1941/01/10   Cancelled treatment:  Pt with OT services.  Will continue efforts.  Treyten Monestime L. Tivis Ringer, [place] CCC/SLP Acute Rehabilitation Services Office number [phone] Pager [phone]          Juan Quam Laurice 03/01/2021, 4:28 PM

## 2021-03-01 NOTE — Care Management Important Message (Signed)
Important Message  Patient Details  Name: Karen Dyer MRN: [id number] Date of Birth: 09-Apr-1941   Medicare Important Message Given:  Yes  Patient not able to sign. Signed copy left at the patient bedside.     Mata Rowen 03/01/2021, 3:47 PM

## 2021-03-01 NOTE — Progress Notes (Signed)
PROGRESS NOTE  Karen Dyer MVE:[id number] DOB: [birth year] DOA: 02/26/2021 PCP: Patient, No Pcp Per  HPI/Recap of past 24 hours: Karen Dyer a 80 y.o.femalewith medical history significant fordementia, hypertension, diastolic CHF, GERD, osteoarthritis, anemia who was sent to [facility] emergency room after a syncopal event at a skilled nursing facility. Patient was scheduled to have a blood transfusion at [facility] yesterday but had a syncopal event prior to going for the transfusion. Is not clear why she was going to the transfusion. Patient is unable to give any history. There is no family present. Right after the syncopal event patient was noted to be hypoxic with O2 sat in the 30s. She was obtunded per EMS report initially but responded to an ammonia inhalant. She had a negative CT of her head at [facility]. She had a significant elevated D-dimer of over 6000. CT angiography of her chest abdomen pelvis was obtained. She does not have any pulmonary embolism but she does have a large right-sided empyema. She has a mildly elevated white blood cell count. She was found to be Hemoccult positive. Her hemoglobin was 7.4at the emergency room. Transfer to [facility] as there are no specialty services of cardiothoracic surgery or interventional radiology at [facility].  Discussed case with interventional radiology and CTS.  She is not a surgical candidate.  Plan for right-sided pleural drain placement by interventional radiology on 02/28/2021.    She has received unit 2 unit PRBCs on 02/27/2021 for hemoglobin of 6.5K.  She is currently on broad-spectrum IV antibiotics Zosyn and IV vancomycin.  MRSA screen positive on 02/27/2021.  03/01/21: Patient was seen and examined at bedside this morning.  She had no new complaints.  Right-sided chest tube in place.  We will repeat chest x-ray.  Assessment/Plan: Principal Problem:   Empyema ([place]) Active Problems:    Syncope   Anemia   GI bleed   Dementia (HCC)   Essential hypertension   Hypoxia  Right-sided empyema, POA, post right chest tube placement by interventional radiology on 02/28/2021. Transferred from [facility] emergency room  Discussed case with interventional radiology and CTS.  She is not a surgical candidate.   Post right-sided pleural drain placement by interventional radiology on 02/28/2021.   She is currently on broad-spectrum IV antibiotics Zosyn and IV vancomycin.  MRSA screen positive on 02/27/2021. Continue IV antibiotics. Continue to follow abscess fluid culture, currently negative to date. Continue to monitor drain output. Rule out dysphagia with speech therapist, pending evaluation. Continue aspiration precautions  Presumptive UTI, POA UA positive for pyuria Urine culture pending. She is currently on broad-spectrum IV antibiotics for her right-sided empyema which will cover for her presumptive UTI.  Syncope, unclear etiology She had a witnessed syncopal event at the nursing home prior to presenting to the ED. Patient is unable to provide a history due to underlying dementia. Orthostatic vital signs positive. Continue to monitor on cardiac telemetry. Continue fall precautions  Acute blood loss anemia She has received unit 2 unit PRBCs on 02/27/2021 for hemoglobin of 6.5K.   Hemoglobin 9.6 from 9.9  Physical debility PT OT to assess  Fall precautions.    Code Status: Full code.  Family Communication: Updated her nephew via phone on 02/27/2021.  Disposition Plan: Likely will return to SNF once interventional radiologist signs off.   Consultants:  Interventional radiology.  Procedures:   Right sided chest tube placement on 02/28/2021 by interventional radiology.  Antimicrobials:  IV vancomycin  IV Zosyn  DVT prophylaxis:  SCDs.  Chemical DVT prophylaxis held due to anemia.  Status is: Inpatient    Dispo:  Patient From: [place]  Planned Disposition: [place]  Anticipated discharge date 03/03/2021.  Medically stable for discharge: No, ongoing management of right-sided empyema and presumptive UTI.          Objective: Vitals:   02/28/21 1240 02/28/21 1441 02/28/21 2113 03/01/21 0315  BP: (!) 123/53 (!) 142/67 (!) 123/57 119/62  Pulse: 84 92 94 90  Resp: (!) 29 16 20 18   Temp:  98 F (36.7 C) 98 F (36.7 C) 98 F (36.7 C)  TempSrc:      SpO2: 97% 100% 95% 97%  Weight:      Height:        Intake/Output Summary (Last 24 hours) at 03/01/2021 1629 Last data filed at 03/01/2021 1300 Gross per 24 hour  Intake 801.42 ml  Output 920 ml  Net -118.58 ml   Filed Weights   02/26/21 2202  Weight: 69.7 kg    Exam:  . General: 80 y.o. year-old female chronically ill-appearing no acute stress.  Alert and confused.  . Cardiovascular: Regular rate and rhythm no rubs or gallops. Marland Kitchen Respiratory: Mild rales at bases no wheezing noted.  Right chest tube in place. . Abdomen: Soft nontender normal bowel sounds present. . Musculoskeletal: No lower extremity edema bilaterally.   . Skin: No ulcerative lesions noted. Marland Kitchen Psychiatry: Mood is appropriate for condition and setting.   Data Reviewed: CBC: Recent Labs  Lab 02/27/21 0130 02/28/21 0648 03/01/21 0632  WBC 12.8* 13.1* 10.1  NEUTROABS 9.2* 8.9*  --   HGB 6.5* 9.9* 9.6*  HCT 21.4* 30.3* 29.6*  MCV 72.8* 75.8* 76.7*  PLT 634* 625* 465*   Basic Metabolic Panel: Recent Labs  Lab 02/27/21 0130 02/27/21 1637 02/28/21 0648 03/01/21 0632  NA 138  --  138 138  K 3.2* 3.8 4.7 3.6  CL 102  --  105 106  CO2 27  --  26 25  GLUCOSE 94  --  94 95  BUN 14  --  9 12  CREATININE 0.76  --  0.68 0.70  CALCIUM 8.9  --  8.9 8.5*  MG  --  2.0 2.0  --   PHOS  --  3.0 2.8  --    GFR: Estimated Creatinine Clearance: 51.6 mL/min (by C-G formula based on SCr of 0.7 mg/dL). Liver Function Tests: Recent Labs  Lab 02/27/21 0130  AST 15   ALT 10  ALKPHOS 84  BILITOT 0.7  PROT 6.1*  ALBUMIN 2.0*   No results for input(s): LIPASE, AMYLASE in the last 168 hours. No results for input(s): AMMONIA in the last 168 hours. Coagulation Profile: No results for input(s): INR, PROTIME in the last 168 hours. Cardiac Enzymes: No results for input(s): CKTOTAL, CKMB, CKMBINDEX, TROPONINI in the last 168 hours. BNP (last 3 results) No results for input(s): PROBNP in the last 8760 hours. HbA1C: No results for input(s): HGBA1C in the last 72 hours. CBG: Recent Labs  Lab 02/28/21 0847 02/28/21 1409 02/28/21 1620 02/28/21 2111 03/01/21 1610  GLUCAP 97 87 87 143* 88   Lipid Profile: No results for input(s): CHOL, HDL, LDLCALC, TRIG, CHOLHDL, LDLDIRECT in the last 72 hours. Thyroid Function Tests: No results for input(s): TSH, T4TOTAL, FREET4, T3FREE, THYROIDAB in the last 72 hours. Anemia Panel: No results for input(s): VITAMINB12, FOLATE, FERRITIN, TIBC, IRON, RETICCTPCT in the last 72 hours. Urine analysis:  Component Value Date/Time   COLORURINE YELLOW 02/27/2021 0601   APPEARANCEUR HAZY (A) 02/27/2021 0601   LABSPEC >1.046 (H) 02/27/2021 0601   PHURINE 5.0 02/27/2021 0601   GLUCOSEU NEGATIVE 02/27/2021 0601   HGBUR MODERATE (A) 02/27/2021 0601   BILIRUBINUR NEGATIVE 02/27/2021 0601   KETONESUR NEGATIVE 02/27/2021 0601   PROTEINUR 100 (A) 02/27/2021 0601   NITRITE NEGATIVE 02/27/2021 0601   LEUKOCYTESUR LARGE (A) 02/27/2021 0601   Sepsis Labs: @LABRCNTIP (procalcitonin:4,lacticidven:4)  ) Recent Results (from the past 240 hour(s))  MRSA PCR Screening     Status: Abnormal   Collection Time: 02/27/21  1:25 AM   Specimen: Nasal Mucosa; Nasopharyngeal  Result Value Ref Range Status   MRSA by PCR POSITIVE (A) NEGATIVE Final    Comment: CRITICAL RESULT CALLED TO, READ BACK BY AND VERIFIED WITH: RN, Tamala Ser [id number] @0448  THANEY Performed at [facility], [place] [address], [place], [place] [address]    Culture, blood (routine x 2)     Status: None (Preliminary result)   Collection Time: 02/27/21  4:37 PM   Specimen: BLOOD RIGHT HAND  Result Value Ref Range Status   Specimen Description BLOOD RIGHT HAND  Final   Special Requests   Final    BOTTLES DRAWN AEROBIC AND ANAEROBIC Blood Culture adequate volume   Culture   Final    NO GROWTH 2 DAYS Performed at [facility], [place] [address], [place], [place] [address]    Report Status PENDING  Incomplete  Culture, blood (routine x 2)     Status: None (Preliminary result)   Collection Time: 02/27/21  4:48 PM   Specimen: BLOOD LEFT HAND  Result Value Ref Range Status   Specimen Description BLOOD LEFT HAND  Final   Special Requests   Final    BOTTLES DRAWN AEROBIC AND ANAEROBIC Blood Culture adequate volume   Culture   Final    NO GROWTH 2 DAYS Performed at [facility], [place] [address] [place], [place] [address]    Report Status PENDING  Incomplete  Aerobic/Anaerobic Culture (surgical/deep wound)     Status: None (Preliminary result)   Collection Time: 02/28/21  1:04 PM   Specimen: Abscess  Result Value Ref Range Status   Specimen Description ABSCESS  Final   Special Requests EMPYEMA  Final   Gram Stain   Final    ABUNDANT WBC PRESENT,BOTH PMN AND MONONUCLEAR NO ORGANISMS SEEN    Culture   Final    NO GROWTH < 24 HOURS Performed at [facility], [place] [address] [place], [place] [address]    Report Status PENDING  Incomplete      Studies: No results found.  Scheduled Meds: . ALPRAZolam  0.5 mg Oral q morning  . Chlorhexidine Gluconate Cloth  6 each Topical Q0600  . lamoTRIgine  200 mg Oral q morning  . mupirocin ointment  1 application Nasal BID  . pantoprazole  40 mg Oral BID  . polyethylene glycol  17 g Oral QODAY  . QUEtiapine  37.5 mg Oral QHS    Continuous Infusions: . piperacillin-tazobactam (ZOSYN)  IV 3.375 g (03/01/21 1409)  . vancomycin Stopped (03/01/21 0053)     LOS: 3 days      Kayleen Memos, MD [facility] Pager [phone]  If 7PM-7AM, please contact night-coverage [email or web address] Password [facility] 03/01/2021, 4:29 PM

## 2021-03-02 ENCOUNTER — Inpatient Hospital Stay (HOSPITAL_COMMUNITY): Payer: Medicare Other

## 2021-03-02 DIAGNOSIS — J869 Pyothorax without fistula: Secondary | ICD-10-CM | POA: Diagnosis not present

## 2021-03-02 LAB — BASIC METABOLIC PANEL
Anion gap: 8 (ref 5–15)
BUN: 14 mg/dL (ref 8–23)
CO2: 25 mmol/L (ref 22–32)
Calcium: 8.4 mg/dL — ABNORMAL LOW (ref 8.9–10.3)
Chloride: 104 mmol/L (ref 98–111)
Creatinine, Ser: 0.69 mg/dL (ref 0.44–1.00)
GFR, Estimated: >60 mL/min (ref >60)
Glucose, Bld: 72 mg/dL (ref 70–99)
Potassium: 3.6 mmol/L (ref 3.5–5.1)
Sodium: 137 mmol/L (ref 135–145)

## 2021-03-02 LAB — CBC
HCT: 29.9 % — ABNORMAL LOW (ref 36.0–46.0)
Hemoglobin: 9.1 g/dL — ABNORMAL LOW (ref 12.0–15.0)
MCH: 24.3 pg — ABNORMAL LOW (ref 26.0–34.0)
MCHC: 30.4 g/dL (ref 30.0–36.0)
MCV: 79.7 fL — ABNORMAL LOW (ref 80.0–100.0)
Platelets: 498 10*3/uL — ABNORMAL HIGH (ref 150–400)
RBC: 3.75 MIL/uL — ABNORMAL LOW (ref 3.87–5.11)
RDW: 21 % — ABNORMAL HIGH (ref 11.5–15.5)
WBC: 9.2 10*3/uL (ref 4.0–10.5)
nRBC: 0 % (ref 0.0–0.2)

## 2021-03-02 LAB — GLUCOSE, CAPILLARY
Glucose-Capillary: 94 mg/dL (ref 70–99)
Glucose-Capillary: 142 mg/dL — ABNORMAL HIGH (ref 70–99)
Glucose-Capillary: 172 mg/dL — ABNORMAL HIGH (ref 70–99)

## 2021-03-02 NOTE — Progress Notes (Signed)
PROGRESS NOTE  Karen Dyer DXA:[id number] DOB: November 13, 1941 DOA: 02/26/2021 PCP: Patient, No Pcp Per  HPI/Recap of past 24 hours: Karen Dyer a 80 y.o.femalewith medical history significant fordementia, hypertension, diastolic CHF, GERD, osteoarthritis, anemia who was sent to [facility] emergency room after a syncopal event at a skilled nursing facility. Patient was scheduled to have a blood transfusion at [facility] yesterday but had a syncopal event prior to going for the transfusion. Is not clear why she was going to the transfusion. Patient is unable to give any history. There is no family present. Right after the syncopal event patient was noted to be hypoxic with O2 sat in the 30s. She was obtunded per EMS report initially but responded to an ammonia inhalant. She had a negative CT of her head at [facility]. She had a significant elevated D-dimer of over 6000. CT angiography of her chest abdomen pelvis was obtained. She does not have any pulmonary embolism but she does have a large right-sided empyema. She has a mildly elevated white blood cell count. She was found to be Hemoccult positive. Her hemoglobin was 7.4at the emergency room. Transfer to [facility] as there are no specialty services of cardiothoracic surgery or interventional radiology at [facility].  Discussed case with interventional radiology and CTS.  She is not a surgical candidate.  Plan for right-sided pleural drain placement by interventional radiology on 02/28/2021.    She has received unit 2 unit PRBCs on 02/27/2021 for hemoglobin of 6.5K.  She is currently on broad-spectrum IV antibiotics Zosyn and IV vancomycin.  MRSA screen positive on 02/27/2021.  03/02/21: Patient was seen and examined at bedside.  She is sitting up in a chair she has no new complaints.  Denies any pain at the site of the chest tube.  Seen by speech therapist with recommendation for regular consistency diet and  thin liquids.  Output from chest tube drain 62 cc in the last 24 hours.  Assessment/Plan: Principal Problem:   Empyema ([place]) Active Problems:   Syncope   Anemia   GI bleed   Dementia (HCC)   Essential hypertension   Hypoxia  Right-sided empyema, POA, post right chest tube placement by interventional radiology on 02/28/2021. Transferred from [facility] emergency room  Discussed case with interventional radiology and CTS.  She is not a surgical candidate.   Post right-sided pleural drain placement by interventional radiology on 02/28/2021.   She is currently on broad-spectrum IV antibiotics Zosyn and IV vancomycin.  MRSA screen positive on 02/27/2021. Continue IV antibiotics. Continue to follow abscess fluid culture, currently negative to date. Continue to monitor drain output. 62 cc chest tube drain output recorded in the last 24 hours. Chest x-ray done on 03/02/2021 personally reviewed, showed no interval change.  Right chest tube in place kinking of the tube.  Right pleural effusion and right lower lobe airspace disease unchanged.  No pneumothorax. Repeat chest x-ray in the morning Continue to monitor chest tube drain output.  Presumptive UTI, POA UA positive for pyuria Urine culture taken on 02/27/2021 in process. She is currently on broad-spectrum IV antibiotics for her right-sided empyema which will cover for her presumptive UTI.  Syncope, likely secondary to orthostatic hypotension. She had a witnessed syncopal event at the nursing home prior to presenting to the ED. Patient is unable to provide a history due to underlying dementia. Orthostatic vital signs positive. Avoid dehydration. Continue to monitor on cardiac telemetry. Continue fall precautions  Acute blood loss anemia She  has received unit 2 unit PRBCs on 02/27/2021 for hemoglobin of 6.5K.   Hemoglobin 9.1 from 9.6. No overt bleeding.  Physical debility PT OT to assess  OT assessment recommended SNF. TOC  consulted to assist with SNF placement. Fall precautions.    Code Status: Full code.  Family Communication: Updated her nephew via phone on 02/27/2021.  Disposition Plan: Likely will return to SNF once interventional radiologist signs off and chest tube is removed.   Consultants:  Interventional radiology.  Procedures:   Right sided chest tube placement on 02/28/2021 by interventional radiology.  Antimicrobials:  IV vancomycin  IV Zosyn  DVT prophylaxis: SCDs.  Chemical DVT prophylaxis held due to anemia.  Status is: Inpatient    Dispo:  Patient From: [place]  Planned Disposition: [place]  Anticipated discharge date 03/03/2021.  Medically stable for discharge: No, ongoing management of right-sided empyema and presumptive UTI.          Objective: Vitals:   03/01/21 0315 03/01/21 2100 03/02/21 0445 03/02/21 1118  BP: 119/62 120/62 (!) 133/57 (!) 110/59  Pulse: 90 91 76   Resp: 18 19 16 18   Temp: 98 F (36.7 C) 97.8 F (36.6 C) 97.8 F (36.6 C) 98.7 F (37.1 C)  TempSrc:  Axillary  Oral  SpO2: 97% 97% 95% 96%  Weight:      Height:        Intake/Output Summary (Last 24 hours) at 03/02/2021 1618 Last data filed at 03/02/2021 0400 Gross per 24 hour  Intake -  Output 52 ml  Net -52 ml   Filed Weights   02/26/21 2202  Weight: 69.7 kg    Exam:  . General: 80 y.o. year-old female frail-appearing no acute distress.  She is alert and interactive. . Cardiovascular: Regular rate and rhythm no rubs or gallops.   Marland Kitchen Respiratory: Mild rales at bases with poor inspiratory effort.  Right chest tube in place.. . Abdomen: Soft nontender Normal Bowel Sounds Present. . Musculoskeletal: No lower extremity edema bilaterally.   . Skin: No ulcerative lesions noted. Marland Kitchen Psychiatry: Mood is appropriate for condition and setting.   Data Reviewed: CBC: Recent Labs  Lab 02/27/21 0130 02/28/21 0648 03/01/21 0632 03/02/21 0200  WBC  12.8* 13.1* 10.1 9.2  NEUTROABS 9.2* 8.9*  --   --   HGB 6.5* 9.9* 9.6* 9.1*  HCT 21.4* 30.3* 29.6* 29.9*  MCV 72.8* 75.8* 76.7* 79.7*  PLT 634* 625* 528* 761*   Basic Metabolic Panel: Recent Labs  Lab 02/27/21 0130 02/27/21 1637 02/28/21 0648 03/01/21 0632 03/02/21 0200  NA 138  --  138 138 137  K 3.2* 3.8 4.7 3.6 3.6  CL 102  --  105 106 104  CO2 27  --  26 25 25   GLUCOSE 94  --  94 95 72  BUN 14  --  9 12 14   CREATININE 0.76  --  0.68 0.70 0.69  CALCIUM 8.9  --  8.9 8.5* 8.4*  MG  --  2.0 2.0  --   --   PHOS  --  3.0 2.8  --   --    GFR: Estimated Creatinine Clearance: 51.6 mL/min (by C-G formula based on SCr of 0.69 mg/dL). Liver Function Tests: Recent Labs  Lab 02/27/21 0130  AST 15  ALT 10  ALKPHOS 84  BILITOT 0.7  PROT 6.1*  ALBUMIN 2.0*   No results for input(s): LIPASE, AMYLASE in the last 168 hours. No results for input(s): AMMONIA  in the last 168 hours. Coagulation Profile: No results for input(s): INR, PROTIME in the last 168 hours. Cardiac Enzymes: No results for input(s): CKTOTAL, CKMB, CKMBINDEX, TROPONINI in the last 168 hours. BNP (last 3 results) No results for input(s): PROBNP in the last 8760 hours. HbA1C: No results for input(s): HGBA1C in the last 72 hours. CBG: Recent Labs  Lab 03/01/21 1610 03/01/21 2125 03/02/21 0743 03/02/21 1141 03/02/21 1608  GLUCAP 88 107* 94 142* 172*   Lipid Profile: No results for input(s): CHOL, HDL, LDLCALC, TRIG, CHOLHDL, LDLDIRECT in the last 72 hours. Thyroid Function Tests: No results for input(s): TSH, T4TOTAL, FREET4, T3FREE, THYROIDAB in the last 72 hours. Anemia Panel: No results for input(s): VITAMINB12, FOLATE, FERRITIN, TIBC, IRON, RETICCTPCT in the last 72 hours. Urine analysis:    Component Value Date/Time   COLORURINE YELLOW 02/27/2021 0601   APPEARANCEUR HAZY (A) 02/27/2021 0601   LABSPEC >1.046 (H) 02/27/2021 0601   PHURINE 5.0 02/27/2021 0601   GLUCOSEU NEGATIVE 02/27/2021 0601    HGBUR MODERATE (A) 02/27/2021 0601   BILIRUBINUR NEGATIVE 02/27/2021 0601   KETONESUR NEGATIVE 02/27/2021 0601   PROTEINUR 100 (A) 02/27/2021 0601   NITRITE NEGATIVE 02/27/2021 0601   LEUKOCYTESUR LARGE (A) 02/27/2021 0601   Sepsis Labs: @LABRCNTIP (procalcitonin:4,lacticidven:4)  ) Recent Results (from the past 240 hour(s))  MRSA PCR Screening     Status: Abnormal   Collection Time: 02/27/21  1:25 AM   Specimen: Nasal Mucosa; Nasopharyngeal  Result Value Ref Range Status   MRSA by PCR POSITIVE (A) NEGATIVE Final    Comment: CRITICAL RESULT CALLED TO, READ BACK BY AND VERIFIED WITH: RNTamala Ser [id number] @0448  THANEY Performed at [facility], [place] [address] [place], [place] [address]   Culture, blood (routine x 2)     Status: None (Preliminary result)   Collection Time: 02/27/21  4:37 PM   Specimen: BLOOD RIGHT HAND  Result Value Ref Range Status   Specimen Description BLOOD RIGHT HAND  Final   Special Requests   Final    BOTTLES DRAWN AEROBIC AND ANAEROBIC Blood Culture adequate volume   Culture   Final    NO GROWTH 3 DAYS Performed at [facility], [place] [address] [place], [place] [address]    Report Status PENDING  Incomplete  Culture, blood (routine x 2)     Status: None (Preliminary result)   Collection Time: 02/27/21  4:48 PM   Specimen: BLOOD LEFT HAND  Result Value Ref Range Status   Specimen Description BLOOD LEFT HAND  Final   Special Requests   Final    BOTTLES DRAWN AEROBIC AND ANAEROBIC Blood Culture adequate volume   Culture   Final    NO GROWTH 3 DAYS Performed at [place] [facility], [place] [address], [place], [place] [address]    Report Status PENDING  Incomplete  Aerobic/Anaerobic Culture (surgical/deep wound)     Status: None (Preliminary result)   Collection Time: 02/28/21  1:04 PM   Specimen: Abscess  Result Value Ref Range Status   Specimen Description ABSCESS  Final   Special Requests EMPYEMA  Final   Gram Stain   Final     ABUNDANT WBC PRESENT,BOTH PMN AND MONONUCLEAR NO ORGANISMS SEEN    Culture   Final    NO GROWTH 2 DAYS NO ANAEROBES ISOLATED; CULTURE IN PROGRESS FOR 5 DAYS Performed at [facility], [place] [address], [place], [place] [address]    Report Status PENDING  Incomplete      Studies: DG CHEST PORT 1 VIEW  Result Date: 03/02/2021 CLINICAL DATA:  Chest tube EXAM: PORTABLE CHEST 1 VIEW COMPARISON:  03/01/2021 FINDINGS: Pigtail chest tube on the right unchanged in position. There appears to be a kink in the tube. No pneumothorax. Right pleural effusion/pleural thickening unchanged with right lower lobe airspace disease. Left lung remains clear.  Negative for heart failure. IMPRESSION: No interval change. Right chest tube in place with kinking of the tube. Right pleural effusion and right lower lobe airspace disease unchanged. No pneumothorax. Electronically Signed   By: Franchot Gallo M.D.   On: 03/02/2021 08:16   DG CHEST PORT 1 VIEW  Result Date: 03/01/2021 CLINICAL DATA:  Chest tube in place. EXAM: PORTABLE CHEST 1 VIEW COMPARISON:  February 27, 2021 FINDINGS: A right-sided chest tube is seen with its distal end overlying the right lung base. Moderate severity right basilar consolidation is again seen. A stable right-sided pleural effusion is noted. No pneumothorax is identified. The cardiac silhouette is mildly enlarged. The visualized skeletal structures are unremarkable. IMPRESSION: 1. Interval right-sided chest tube placement positioning, as described above. Electronically Signed   By: Virgina Norfolk M.D.   On: 03/01/2021 17:47    Scheduled Meds: . ALPRAZolam  0.5 mg Oral q morning  . Chlorhexidine Gluconate Cloth  6 each Topical Q0600  . lamoTRIgine  200 mg Oral q morning  . mupirocin ointment  1 application Nasal BID  . pantoprazole  40 mg Oral BID  . polyethylene glycol  17 g Oral QODAY  . QUEtiapine  37.5 mg Oral QHS    Continuous Infusions: . piperacillin-tazobactam (ZOSYN)  IV  3.375 g (03/02/21 0844)  . vancomycin 1,250 mg (03/01/21 2303)     LOS: 4 days     Kayleen Memos, MD [facility] Pager [phone]  If 7PM-7AM, please contact night-coverage [email or web address] Password [facility] 03/02/2021, 4:18 PM

## 2021-03-02 NOTE — Evaluation (Signed)
Clinical/Bedside Swallow Evaluation Patient Details  Name: Karen Dyer MRN: [id number] Date of Birth: 04-16-1941  Today's Date: 03/02/2021 Time: SLP Start Time (ACUTE ONLY): 0160 SLP Stop Time (ACUTE ONLY): 1431 SLP Time Calculation (min) (ACUTE ONLY): 10 min  Past Medical History: History reviewed. No pertinent past medical history. Past Surgical History: History reviewed. No pertinent surgical history. HPI:  Pt is an 80 y.o. female with medical history significant for dementia, hypertension, diastolic CHF, GERD, osteoarthritis, anemia who was sent to [facility] ED from SNF after a syncopal event and hypoxia with O2 sats in the 30s.  CT head at [facility] was negative. Pt was transferred to [facility] for further workup. Dx large right-sided empyema. Right chest tube 3/13.   Assessment / Plan / Recommendation Clinical Impression  Pt was feeding herself lunch upon entering room.  Edentulous; no focal deficits.  Able to follow simple commands and express basic needs, speech is difficult to understand.  Despite absence of teeth, pt was able to manage regular diet consistencies (cookie, veggie burger) with adequate mastication.  She avoided foods she could not chew (corn).  She consumed mixed consistencies of thin liquids and solids with no s/s of aspiration.  No evidence of dysphagia.  Recommend continuing regular consistency diet, thin liquids.  No SLP f/u is needed. Our service will sign off. SLP Visit Diagnosis: Dysphagia, unspecified (R13.10)    Aspiration Risk  No limitations    Diet Recommendation   regular solids, thin liquids  Medication Administration: Whole meds with liquid    Other  Recommendations Oral Care Recommendations: Oral care BID   Follow up Recommendations None      Frequency and Duration            Prognosis        Swallow Study   General Date of Onset: 02/27/21 HPI: Pt is an 80 y.o. female with medical history significant for dementia, hypertension,  diastolic CHF, GERD, osteoarthritis, anemia who was sent to [facility] ED after a syncopal event and hypoxia with O2 sats in the 30s.  CT head at [facility] was negative. Pt was transferred to [facility] for further workup. Dx large right-sided empyema. Right chest tube 3/13. Type of Study: Bedside Swallow Evaluation Previous Swallow Assessment: no Diet Prior to this Study: Regular;Thin liquids Temperature Spikes Noted: No Respiratory Status: Room air History of Recent Intubation: No Behavior/Cognition: Alert Oral Cavity Assessment: Within Functional Limits Oral Care Completed by SLP: No Oral Cavity - Dentition: Edentulous Vision: Functional for self-feeding Self-Feeding Abilities: Able to feed self Patient Positioning: Upright in chair Baseline Vocal Quality: Normal Volitional Cough: Cognitively unable to elicit Volitional Swallow: Able to elicit    Oral/Motor/Sensory Function Overall Oral Motor/Sensory Function: Within functional limits   Ice Chips Ice chips: Within functional limits   Thin Liquid Thin Liquid: Within functional limits    Nectar Thick Nectar Thick Liquid: Not tested   Honey Thick Honey Thick Liquid: Not tested   Puree Puree: Within functional limits   Solid     Solid: Within functional limits      Juan Quam Laurice 03/02/2021,2:42 PM  Estill Bamberg L. Tivis Ringer, [place] Office number [phone] Pager [phone]

## 2021-03-02 NOTE — Evaluation (Signed)
Occupational Therapy Evaluation Patient Details Name: Karen Dyer MRN: [id number] DOB: 1941/06/19 Today's Date: 03/02/2021    History of Present Illness 80 yo admitted 3/11 from SNF with syncopal episode. Pt with Rt empyema and anemia. PMhx: dementia, HTN, dCHF, GERD, osteoarthritis, anemia   Clinical Impression   Pt PTA: Per chart, pt is from SNF. Pt was ambulatory and performing some of her ADL. Pt with dementia at baseline. Pt limited by decreased activity tolerance, decreased strength and decreased ability to care for self. Pt O2 >90% on RA. Pt following all 1 step commands with cues and pleasant throughout session. Pt set-upA to minA for ADL and minguardA for mobility. Pt would benefit from continued OT skilled services. OT following acutely.    Follow Up Recommendations  SNF;Supervision - Intermittent    Equipment Recommendations  None recommended by OT    Recommendations for Other Services       Precautions / Restrictions Precautions Precautions: Fall;Other (comment) Precaution Comments: chest tube Restrictions Weight Bearing Restrictions: No      Mobility Bed Mobility Overal bed mobility: Needs Assistance Bed Mobility: Supine to Sit     Supine to sit: Supervision     General bed mobility comments: supervisionA for lines    Transfers Overall transfer level: Needs assistance   Transfers: Sit to/from Stand Sit to Stand: Min guard         General transfer comment: no dizziness in standing    Balance Overall balance assessment: Needs assistance   Sitting balance-Leahy Scale: Good Sitting balance - Comments: EOB   Standing balance support: Bilateral upper extremity supported;No upper extremity supported;During functional activity Standing balance-Leahy Scale: Fair Standing balance comment: RW for stability; pt able to perform pericare without assist in standing.                           ADL either performed or assessed with clinical  judgement   ADL Overall ADL's : Needs assistance/impaired Eating/Feeding: Set up;Sitting   Grooming: Min guard;Standing   Upper Body Bathing: Min guard;Standing   Lower Body Bathing: Minimal assistance;Sitting/lateral leans;Sit to/from stand   Upper Body Dressing : Min guard;Sitting;Cueing for safety;Cueing for sequencing   Lower Body Dressing: Set up;Sitting/lateral leans   Toilet Transfer: Min guard;Ambulation;BSC;Grab bars;RW   Toileting- Clothing Manipulation and Hygiene: Minimal assistance;Sitting/lateral lean;Sit to/from stand;Cueing for safety Toileting - Clothing Manipulation Details (indicate cue type and reason): MinA for cleanliness     Functional mobility during ADLs: Min guard;Rolling walker;Cueing for safety General ADL Comments: Pt limited by decreased activity tolerance, decreased strength and decreased ability to care for self. Pt O2 >90% on RA. Pt following all commands and pleasant throughout session.     Vision Baseline Vision/History: No visual deficits Patient Visual Report: No change from baseline Vision Assessment?: No apparent visual deficits     Perception     Praxis      Pertinent Vitals/Pain Pain Assessment: No/denies pain     Hand Dominance Right   Extremity/Trunk Assessment Upper Extremity Assessment Upper Extremity Assessment: Generalized weakness   Lower Extremity Assessment Lower Extremity Assessment: Generalized weakness   Cervical / Trunk Assessment Cervical / Trunk Assessment: Kyphotic   Communication Communication Communication: No difficulties   Cognition Arousal/Alertness: Awake/alert Behavior During Therapy: WFL for tasks assessed/performed Overall Cognitive Status: No family/caregiver present to determine baseline cognitive functioning  General Comments: Pt with dementia; per nephew, pt with an intellectual disability   General Comments  VSS on RA.    Exercises      Shoulder Instructions      Home Living Family/patient expects to be discharged to:: Skilled nursing facility                                        Prior Functioning/Environment Level of Independence: Needs assistance  Gait / Transfers Assistance Needed: pt reports assist with RW for all gait but walks in hallway ADL's / Homemaking Assistance Needed: staff assist for ADL and iADL            OT Problem List: Decreased strength;Decreased activity tolerance;Impaired balance (sitting and/or standing);Decreased safety awareness;Increased edema;Decreased cognition      OT Treatment/Interventions: Self-care/ADL training;Therapeutic exercise;Energy conservation;DME and/or AE instruction;Cognitive remediation/compensation;Patient/family education;Balance training    OT Goals(Current goals can be found in the care plan section) Acute Rehab OT Goals Patient Stated Goal: return home and sleep OT Goal Formulation: With patient Time For Goal Achievement: 03/16/21 Potential to Achieve Goals: Good ADL Goals Pt Will Perform Grooming: with supervision;standing Additional ADL Goal #1: Pt will increase to supervisionA for ADL standing at sink x6 mins. Additional ADL Goal #2: Pt will follow 1-2 step commands with minimal cues to sequence through tasks.  OT Frequency: Min 2X/week   Barriers to D/C:            Co-evaluation              AM-PAC OT "6 Clicks" Daily Activity     Outcome Measure Help from another person eating meals?: None Help from another person taking care of personal grooming?: A Little Help from another person toileting, which includes using toliet, bedpan, or urinal?: A Little Help from another person bathing (including washing, rinsing, drying)?: A Little Help from another person to put on and taking off regular upper body clothing?: A Little Help from another person to put on and taking off regular lower body clothing?: A Little 6 Click Score: 19    End of Session Equipment Utilized During Treatment: Gait belt;Rolling walker Nurse Communication: Mobility status  Activity Tolerance: Patient tolerated treatment well Patient left: in chair;with call bell/phone within reach;with chair alarm set  OT Visit Diagnosis: Unsteadiness on feet (R26.81);Muscle weakness (generalized) (M62.81)                Time: 3704-8889 OT Time Calculation (min): 27 min Charges:  OT General Charges $OT Visit: 1 Visit OT Evaluation $OT Eval Moderate Complexity: 1 Mod OT Treatments $Self Care/Home Management : 8-22 mins  Jefferey Pica, OTR/L Acute Rehabilitation Services Pager: [phone] Office: [phone]   Brier Reid C 03/02/2021, 3:55 PM

## 2021-03-03 ENCOUNTER — Inpatient Hospital Stay (HOSPITAL_COMMUNITY): Payer: Medicare Other

## 2021-03-03 DIAGNOSIS — J869 Pyothorax without fistula: Secondary | ICD-10-CM | POA: Diagnosis not present

## 2021-03-03 DIAGNOSIS — Z9689 Presence of other specified functional implants: Secondary | ICD-10-CM

## 2021-03-03 DIAGNOSIS — F0391 Unspecified dementia with behavioral disturbance: Secondary | ICD-10-CM

## 2021-03-03 LAB — CBC
HCT: 30.1 % — ABNORMAL LOW (ref 36.0–46.0)
Hemoglobin: 9.6 g/dL — ABNORMAL LOW (ref 12.0–15.0)
MCH: 24.9 pg — ABNORMAL LOW (ref 26.0–34.0)
MCHC: 31.9 g/dL (ref 30.0–36.0)
MCV: 78 fL — ABNORMAL LOW (ref 80.0–100.0)
Platelets: 545 10*3/uL — ABNORMAL HIGH (ref 150–400)
RBC: 3.86 MIL/uL — ABNORMAL LOW (ref 3.87–5.11)
RDW: 21.2 % — ABNORMAL HIGH (ref 11.5–15.5)
WBC: 9 10*3/uL (ref 4.0–10.5)
nRBC: 0 % (ref 0.0–0.2)

## 2021-03-03 LAB — BASIC METABOLIC PANEL
Anion gap: 9 (ref 5–15)
BUN: 15 mg/dL (ref 8–23)
CO2: 24 mmol/L (ref 22–32)
Calcium: 8.5 mg/dL — ABNORMAL LOW (ref 8.9–10.3)
Chloride: 106 mmol/L (ref 98–111)
Creatinine, Ser: 0.65 mg/dL (ref 0.44–1.00)
GFR, Estimated: >60 mL/min (ref >60)
Glucose, Bld: 107 mg/dL — ABNORMAL HIGH (ref 70–99)
Potassium: 3.3 mmol/L — ABNORMAL LOW (ref 3.5–5.1)
Sodium: 139 mmol/L (ref 135–145)

## 2021-03-03 LAB — URINE CULTURE: Culture: NO GROWTH

## 2021-03-03 LAB — VANCOMYCIN, PEAK: Vancomycin Pk: 26 ug/mL — ABNORMAL LOW (ref 30–40)

## 2021-03-03 MED ORDER — POTASSIUM CHLORIDE CRYS ER 20 MEQ PO TBCR
40.0000 meq | EXTENDED_RELEASE_TABLET | Freq: Once | ORAL | Status: AC
  Administered 2021-03-04: 40 meq via ORAL
  Filled 2021-03-03: qty 2

## 2021-03-03 MED ORDER — SODIUM CHLORIDE (PF) 0.9 % IJ SOLN
10.0000 mg | Freq: Every day | INTRAMUSCULAR | Status: AC
  Administered 2021-03-03 – 2021-03-05 (×3): 10 mg via INTRAPLEURAL
  Filled 2021-03-03 (×4): qty 10

## 2021-03-03 MED ORDER — STERILE WATER FOR INJECTION IJ SOLN
5.0000 mg | Freq: Every day | RESPIRATORY_TRACT | Status: AC
  Administered 2021-03-03 – 2021-03-05 (×3): 5 mg via INTRAPLEURAL
  Filled 2021-03-03 (×4): qty 5

## 2021-03-03 NOTE — Progress Notes (Signed)
Fell from chair. Did not hit head. Landed on right side. Right wrist sore. Xray ordered.

## 2021-03-03 NOTE — Progress Notes (Signed)
TCTS Progress Note  80 yo lady admitted with right empyema. Evaluated by Dr. Cyndia Bent and considered not a candidate for surgery. Pigtail catheter placed by IR.  CXR reviewed--some resolution of right chest opacity. Have asked Dr. Tamala Julian, CCM to help manage empyema. Appreciate their expertise. We will sign off. Karen Dyer Z. Orvan Seen, [place]

## 2021-03-03 NOTE — Consult Note (Signed)
NAME:  Karen Dyer, MRN:  [id number], DOB:  12-08-1941, LOS: 5 ADMISSION DATE:  02/26/2021, CONSULTATION DATE:  03/03/21 REFERRING MD:  Orvan Seen, CHIEF COMPLAINT:  none   Brief History   80 year old SNF resident with advanced dementia presenting with empyema.  History of present illness   80 year old SNF resident with advanced dementia presenting with empyema.  Medical history as below.  All questions I ask she states "I'm fine."  Uncooperative with exam and yells at me when attempted.  History per chart review.  Had ?syncopal event and hypoxemia at [facility], transferred here after CT showed emypema.  Pigtail placed 3 days ago.  Not a decortication candidate. PCCM consulted to help manage empyema medically.  Past Medical History  HFPEF GERD OA Anemia  Significant Hospital Events   3/13 pigtail inserted by IR  Consults:  TCTS  Procedures:  3/13 pigtail inserted by IR  Significant Diagnostic Tests:  CXR: R empyema  Micro Data:  fluid  Antimicrobials:  Vanc    Interim history/subjective:  consulted  Objective   Blood pressure (!) 110/59, pulse 76, temperature 98.7 F (37.1 C), temperature source Oral, resp. rate 18, height 5\' 6"  (1.676 m), weight 69.7 kg, SpO2 96 %.        Intake/Output Summary (Last 24 hours) at 03/03/2021 1137 Last data filed at 03/03/2021 0630 Gross per 24 hour  Intake 248.9 ml  Output 120 ml  Net 128.9 ml   Filed Weights   02/26/21 2202  Weight: 69.7 kg    Examination: Elderly lady in NAD sitting in chair She refused a heart and lung exam After repeated reassurance, evaluated chest tube, it is patent and able to be flushed, I can aspirate thick fibrinous debris from it She is able to answer simple questions and is oriented to self  Labs benign, mild leukocytosis and thrombocytosis improved  Resolved Hospital Problem list   n/a  Assessment & Plan:  Acute respiratory failure and syncope of unclear origin resolved R empyema with  negative cultures Advanced dementia with behavioral disturbance Muscular deconditioning  - TPA/dornase to chest tube x 3 days then would pull chest tube - Chest tube to -20cmH2O - Would do zosyn or augmentin x 4 weeks and repeat imaging in [place] - Will follow with you  Best practice:  Per primary   GOC (weekly)  Per primary  Labs   CBC: Recent Labs  Lab 02/27/21 0130 02/28/21 0648 03/01/21 0632 03/02/21 0200 03/03/21 0407  WBC 12.8* 13.1* 10.1 9.2 9.0  NEUTROABS 9.2* 8.9*  --   --   --   HGB 6.5* 9.9* 9.6* 9.1* 9.6*  HCT 21.4* 30.3* 29.6* 29.9* 30.1*  MCV 72.8* 75.8* 76.7* 79.7* 78.0*  PLT 634* 625* 528* 498* 545*    Basic Metabolic Panel: Recent Labs  Lab 02/27/21 0130 02/27/21 1637 02/28/21 0648 03/01/21 0632 03/02/21 0200 03/03/21 0407  NA 138  --  138 138 137 139  K 3.2* 3.8 4.7 3.6 3.6 3.3*  CL 102  --  105 106 104 106  CO2 27  --  26 25 25 24   GLUCOSE 94  --  94 95 72 107*  BUN 14  --  9 12 14 15   CREATININE 0.76  --  0.68 0.70 0.69 0.65  CALCIUM 8.9  --  8.9 8.5* 8.4* 8.5*  MG  --  2.0 2.0  --   --   --   PHOS  --  3.0 2.8  --   --   --  GFR: Estimated Creatinine Clearance: 51.6 mL/min (by C-G formula based on SCr of 0.65 mg/dL). Recent Labs  Lab 02/27/21 1637 02/28/21 0648 03/01/21 0632 03/02/21 0200 03/03/21 0407  PROCALCITON 0.19  --   --   --   --   WBC  --  13.1* 10.1 9.2 9.0    Liver Function Tests: Recent Labs  Lab 02/27/21 0130  AST 15  ALT 10  ALKPHOS 84  BILITOT 0.7  PROT 6.1*  ALBUMIN 2.0*   No results for input(s): LIPASE, AMYLASE in the last 168 hours. No results for input(s): AMMONIA in the last 168 hours.  ABG No results found for: PHART, PCO2ART, PO2ART, HCO3, TCO2, ACIDBASEDEF, O2SAT   Coagulation Profile: No results for input(s): INR, PROTIME in the last 168 hours.  Cardiac Enzymes: No results for input(s): CKTOTAL, CKMB, CKMBINDEX, TROPONINI in the last 168 hours.  HbA1C: No results found for:  HGBA1C  CBG: Recent Labs  Lab 03/01/21 1610 03/01/21 2125 03/02/21 0743 03/02/21 1141 03/02/21 1608  GLUCAP 88 107* 94 142* 172*    Review of Systems:   Cannot assess due to degree of dementia  Past Medical History  . Adult failure to thrive  . Anemia  . Dementia  . GERD (gastroesophageal reflux disease)  . Hypertension  . Nummular dermatitis  . Osteoporosis  . Pacemaker  . Peripheral vascular disease ([place])  . Tinea corporis  . Tinea pedis     Surgical History   . BONY PELVIS SURGERY  . HIP SURGERY Left     Social History  . Smoking status: Former Research scientist (life sciences)  . Smokeless tobacco: Never Used  . Alcohol use No  . Drug use: No  . Sexual activity: Not Currently     Family History   No family history of empyema  Allergies Allergies  Allergen Reactions  . Aspirin Other (See Comments)    Unknown reaction - reported by [facility] 2017   . Other Other (See Comments)    Unknown reaction to Nuts, oranges - reported by [facility] 2017     Home Medications  Prior to Admission medications   Medication Sig Start Date End Date Taking? Authorizing Provider  acetaminophen (TYLENOL) 500 MG tablet Take 1,000 mg by mouth every 12 (twelve) hours.   Yes [provider]  ALPRAZolam Duanne Moron) 0.5 MG tablet Take 0.5 mg by mouth every morning.   Yes [provider]  Cholecalciferol (VITAMIN D3) 1.25 MG (50000 UT) CAPS Take 50,000 Units by mouth every Monday.   Yes [provider]  lamoTRIgine (LAMICTAL) 200 MG tablet Take 200 mg by mouth every morning.   Yes [provider]  melatonin 5 MG TABS Take 5 mg by mouth at bedtime.   Yes [provider]  Nutritional Supplements (BOOST VERY HIGH CALORIE) LIQD Take 1 Can by mouth 2 (two) times daily.   Yes [provider]  ondansetron (ZOFRAN) 4 MG tablet Take 4 mg by mouth every 6 (six) hours as needed for nausea or vomiting.   Yes [provider]  oxyCODONE (OXY  IR/ROXICODONE) 5 MG immediate release tablet Take 5 mg by mouth every 4 (four) hours as needed (pain).   Yes [provider]  pantoprazole (PROTONIX) 40 MG tablet Take 40 mg by mouth 2 (two) times daily.   Yes [provider]  polyethylene glycol (MIRALAX / GLYCOLAX) 17 g packet Take 17 g by mouth every other day.   Yes [provider]  QUEtiapine (SEROQUEL) 25  MG tablet Take 37.5 mg by mouth at bedtime.   Yes [provider]  amoxicillin-clavulanate (AUGMENTIN) 875-125 MG tablet Take 1 tablet by mouth 2 (two) times daily.    [provider]  levofloxacin (LEVAQUIN) 750 MG tablet Take 750 mg by mouth at bedtime.    [provider]

## 2021-03-03 NOTE — Progress Notes (Addendum)
At approximately 2000 while in another patient's room a loud commotion was heard. Upon entering the room that patient was laying on her right side in front of her recliner. On further assessment, the patient complained of right wrist pain, denied hitting her head. Patient was transferred to her bed and her VS, skin, and neuro was assessed and provider was notified. An xray was ordered for right wrist which resulted in no acute findings. Attempted to reach out to patient's nephew but no answer, expecting a callback. Patient in bed resting, call bell within reach, bed in lowest position and wheels locked. Floor mats at bedside. Will continue to monitor patient.   Update: At [place] patient's nephew (Darrin)  returned phone call and was notified of patient's event and current status. He was appreciative of notifying him and would like it if a provider could call him back between the hours of 1200-1300 or anytime after 1600 as he does not have a phone signal outside of those times for an update and potential day of discharge.

## 2021-03-03 NOTE — Procedures (Signed)
Pleural Fibrinolytic Administration Procedure Note  Karen Dyer  [id number]  October 03, 1941  Date:03/03/21  Time:1:37 PM   Provider Performing:Forever Arechiga C Tamala Julian   Procedure: Pleural Fibrinolysis Initial day [phone])  Indication(s) Fibrinolysis of complicated pleural effusion  Consent Risks of the procedure as well as the alternatives and risks of each were explained to the patient and/or caregiver.  Consent for the procedure was obtained.   Anesthesia None   Time Out Verified patient identification, verified procedure, site/side was marked, verified correct patient position, special equipment/implants available, medications/allergies/relevant history reviewed, required imaging and test results available.   Sterile Technique Hand hygiene, gloves   Procedure Description Existing pleural catheter was cleaned and accessed in sterile manner.  10mg  of tPA in 30cc of saline and 5mg  of dornase in 30cc of sterile water were injected into pleural space using existing pleural catheter.  Catheter will be clamped for 1 hour and then placed back to suction.   Complications/Tolerance None; patient tolerated the procedure well.  EBL None   Specimen(s) None

## 2021-03-03 NOTE — Progress Notes (Signed)
PROGRESS NOTE  Karen Dyer [id number]:[id number] DOB: September 24, 1941 DOA: 02/26/2021 PCP: Patient, No Pcp Per   LOS: 5 days   Brief Narrative / Interim history: Karen Dyer a 80 y.o.femalewith medical history significant fordementia, hypertension, diastolic CHF, GERD, osteoarthritis, anemia who was sent to [facility] emergency room after a syncopal event at a skilled nursing facility. Patient was scheduled to have a blood transfusion at [facility] yesterday but had a syncopal event prior to going for the transfusion. Is not clear why she was going to the transfusion.  She was noted to be hypoxic and had a CT of the chest abdomen pelvis which showed a large right-sided empyema.  She was transferred to [facility], cardiothoracic surgery consulted, status post chest tube placement by IR on 3/13.  Subjective / 24h Interval events: For unknown reasons patient seems to be very upset this morning, she is alert sitting in the chair but does not want to talk to me and tells me to "get out"  Assessment & Plan: Principal Problem Right-sided empyema, status post chest tube placement by IR on 3/13 -Reconsulted cardiothoracic surgery, IR signed off 2 days ago -Continue IV antibiotics, follow cultures. Narrow antibiotics once chest tube is out.  Drain output minimal.  She refused chest x-ray this morning, hopefully she will agree later on the day  Active Problems Presumptive UTI -Cultures pending, no growth.  On broad-spectrum antibiotics.  Syncope, likely secondary to orthostatic hypotension -Unable to provide history due to underlying dementia, she was orthostatic.  Acute blood loss anemia -Received 2 units of packed red blood cells for hemoglobin of 6.5.  Hemoglobin improved appropriately and has remained stable  Physical debility, underlying dementia -Needs SNF upon discharge  Scheduled Meds:  ALPRAZolam  0.5 mg Oral q morning   Chlorhexidine Gluconate Cloth  6 each  Topical Q0600   lamoTRIgine  200 mg Oral q morning   mupirocin ointment  1 application Nasal BID   pantoprazole  40 mg Oral BID   polyethylene glycol  17 g Oral QODAY   potassium chloride  40 mEq Oral Once   QUEtiapine  37.5 mg Oral QHS   Continuous Infusions:  piperacillin-tazobactam (ZOSYN)  IV 3.375 g (03/03/21 0006)   vancomycin 1,250 mg (03/02/21 2133)   PRN Meds:.acetaminophen, traMADol  Diet Orders (From admission, onward)    Start     Ordered   02/28/21 1421  Diet regular Room service appropriate? No; Fluid consistency: Thin  Diet effective now       Question Answer Comment  Room service appropriate? No   Fluid consistency: Thin      02/28/21 1420          DVT prophylaxis: SCDs Start: 02/27/21 0125     Code Status: Full Code  Family Communication: no family at beside, will call in the afternoon  Status is: Inpatient  Remains inpatient appropriate because:Inpatient level of care appropriate due to severity of illness   Dispo:  Patient From: [place]  Planned Disposition: [place]  Medically stable for discharge: No     Level of care: Telemetry Cardiac  Consultants:  Cardiothoracic surgery IR  Procedures:  Chest tube placement 3/13  Microbiology  Cultures - pending, NGTD  Antimicrobials: Vancomycin / Zosyn 3/12 >>    Objective: Vitals:   03/01/21 0315 03/01/21 2100 03/02/21 0445 03/02/21 1118  BP: 119/62 120/62 (!) 133/57 (!) 110/59  Pulse: 90 91 76   Resp: 18 19 16 18   Temp: 98  F (36.7 C) 97.8 F (36.6 C) 97.8 F (36.6 C) 98.7 F (37.1 C)  TempSrc:  Axillary  Oral  SpO2: 97% 97% 95% 96%  Weight:      Height:        Intake/Output Summary (Last 24 hours) at 03/03/2021 0806 Last data filed at 03/03/2021 0630 Gross per 24 hour  Intake 248.9 ml  Output 120 ml  Net 128.9 ml   Filed Weights   02/26/21 2202  Weight: 69.7 kg    Examination:  Constitutional: NAD  Refused physical  exam  Data Reviewed: I have independently reviewed following labs and imaging studies   CBC: Recent Labs  Lab 02/27/21 0130 02/28/21 0648 03/01/21 0632 03/02/21 0200 03/03/21 0407  WBC 12.8* 13.1* 10.1 9.2 9.0  NEUTROABS 9.2* 8.9*  --   --   --   HGB 6.5* 9.9* 9.6* 9.1* 9.6*  HCT 21.4* 30.3* 29.6* 29.9* 30.1*  MCV 72.8* 75.8* 76.7* 79.7* 78.0*  PLT 634* 625* 528* 498* 630*   Basic Metabolic Panel: Recent Labs  Lab 02/27/21 0130 02/27/21 1637 02/28/21 0648 03/01/21 0632 03/02/21 0200 03/03/21 0407  NA 138  --  138 138 137 139  K 3.2* 3.8 4.7 3.6 3.6 3.3*  CL 102  --  105 106 104 106  CO2 27  --  26 25 25 24   GLUCOSE 94  --  94 95 72 107*  BUN 14  --  9 12 14 15   CREATININE 0.76  --  0.68 0.70 0.69 0.65  CALCIUM 8.9  --  8.9 8.5* 8.4* 8.5*  MG  --  2.0 2.0  --   --   --   PHOS  --  3.0 2.8  --   --   --    Liver Function Tests: Recent Labs  Lab 02/27/21 0130  AST 15  ALT 10  ALKPHOS 84  BILITOT 0.7  PROT 6.1*  ALBUMIN 2.0*   Coagulation Profile: No results for input(s): INR, PROTIME in the last 168 hours. HbA1C: No results for input(s): HGBA1C in the last 72 hours. CBG: Recent Labs  Lab 03/01/21 1610 03/01/21 2125 03/02/21 0743 03/02/21 1141 03/02/21 1608  GLUCAP 88 107* 94 142* 172*    Recent Results (from the past 240 hour(s))  MRSA PCR Screening     Status: Abnormal   Collection Time: 02/27/21  1:25 AM   Specimen: Nasal Mucosa; Nasopharyngeal  Result Value Ref Range Status   MRSA by PCR POSITIVE (A) NEGATIVE Final    Comment: CRITICAL RESULT CALLED TO, READ BACK BY AND VERIFIED WITH: RN, Tamala Ser [id number] @0448  THANEY Performed at [facility] [address], [place], [place] [address]   Culture, blood (routine x 2)     Status: None (Preliminary result)   Collection Time: 02/27/21  4:37 PM   Specimen: BLOOD RIGHT HAND  Result Value Ref Range Status   Specimen Description BLOOD RIGHT HAND  Final   Special Requests   Final     BOTTLES DRAWN AEROBIC AND ANAEROBIC Blood Culture adequate volume   Culture   Final    NO GROWTH 3 DAYS Performed at [facility], [place] [address] [place], [place] [address]    Report Status PENDING  Incomplete  Culture, blood (routine x 2)     Status: None (Preliminary result)   Collection Time: 02/27/21  4:48 PM   Specimen: BLOOD LEFT HAND  Result Value Ref Range Status   Specimen Description BLOOD  LEFT HAND  Final   Special Requests   Final    BOTTLES DRAWN AEROBIC AND ANAEROBIC Blood Culture adequate volume   Culture   Final    NO GROWTH 3 DAYS Performed at [facility], [address] [place], [place] [address]    Report Status PENDING  Incomplete  Aerobic/Anaerobic Culture (surgical/deep wound)     Status: None (Preliminary result)   Collection Time: 02/28/21  1:04 PM   Specimen: Abscess  Result Value Ref Range Status   Specimen Description ABSCESS  Final   Special Requests EMPYEMA  Final   Gram Stain   Final    ABUNDANT WBC PRESENT,BOTH PMN AND MONONUCLEAR NO ORGANISMS SEEN    Culture   Final    NO GROWTH 2 DAYS NO ANAEROBES ISOLATED; CULTURE IN PROGRESS FOR 5 DAYS Performed at [facility], [place] [address] [place], [place] [address]    Report Status PENDING  Incomplete     Radiology Studies: No results found.   Marzetta Board, MD, PhD [facility]  Between 7 am - 7 pm I am available, please contact me via Amion or Securechat  Between 7 pm - 7 am I am not available, please contact night coverage MD/APP via Amion

## 2021-03-04 ENCOUNTER — Inpatient Hospital Stay (HOSPITAL_COMMUNITY): Payer: Medicare Other

## 2021-03-04 DIAGNOSIS — J869 Pyothorax without fistula: Secondary | ICD-10-CM | POA: Diagnosis not present

## 2021-03-04 LAB — CBC
HCT: 31.3 % — ABNORMAL LOW (ref 36.0–46.0)
Hemoglobin: 9.6 g/dL — ABNORMAL LOW (ref 12.0–15.0)
MCH: 24.1 pg — ABNORMAL LOW (ref 26.0–34.0)
MCHC: 30.7 g/dL (ref 30.0–36.0)
MCV: 78.6 fL — ABNORMAL LOW (ref 80.0–100.0)
Platelets: 581 10*3/uL — ABNORMAL HIGH (ref 150–400)
RBC: 3.98 MIL/uL (ref 3.87–5.11)
RDW: 21.3 % — ABNORMAL HIGH (ref 11.5–15.5)
WBC: 7.6 10*3/uL (ref 4.0–10.5)
nRBC: 0 % (ref 0.0–0.2)

## 2021-03-04 LAB — GLUCOSE, CAPILLARY
Glucose-Capillary: 97 mg/dL (ref 70–99)
Glucose-Capillary: 100 mg/dL — ABNORMAL HIGH (ref 70–99)
Glucose-Capillary: 89 mg/dL (ref 70–99)

## 2021-03-04 LAB — BASIC METABOLIC PANEL
Anion gap: 8 (ref 5–15)
BUN: 9 mg/dL (ref 8–23)
CO2: 26 mmol/L (ref 22–32)
Calcium: 8.5 mg/dL — ABNORMAL LOW (ref 8.9–10.3)
Chloride: 104 mmol/L (ref 98–111)
Creatinine, Ser: 0.58 mg/dL (ref 0.44–1.00)
GFR, Estimated: >60 mL/min (ref >60)
Glucose, Bld: 101 mg/dL — ABNORMAL HIGH (ref 70–99)
Potassium: 3.2 mmol/L — ABNORMAL LOW (ref 3.5–5.1)
Sodium: 138 mmol/L (ref 135–145)

## 2021-03-04 LAB — CULTURE, BLOOD (ROUTINE X 2)
Culture: NO GROWTH
Culture: NO GROWTH
Special Requests: ADEQUATE
Special Requests: ADEQUATE

## 2021-03-04 MED ORDER — POTASSIUM CHLORIDE CRYS ER 20 MEQ PO TBCR
40.0000 meq | EXTENDED_RELEASE_TABLET | Freq: Once | ORAL | Status: DC
  Filled 2021-03-04: qty 2

## 2021-03-04 NOTE — Progress Notes (Signed)
Occupational Therapy Treatment Patient Details Name: Karen Dyer MRN: [id number] DOB: September 24, 1941 Today's Date: 03/04/2021    History of present illness 80 yo admitted 3/11 from SNF with syncopal episode. Pt with Rt empyema and anemia. PMhx: dementia, HTN, dCHF, GERD, osteoarthritis, anemia   OT comments  Pt making gradual progress towards OT goals this session. Pt pleasantly confused upon COTA arrival, but agreeable to OOB mobility to take orthostatic vitals; however pt with incontinent BM with pt needing total A for pericare and MINA to roll fully into side lying for pericare. Deferred further mobility d/t incontinence. Pt would continue to benefit from skilled occupational therapy while admitted and after d/c to address the below listed limitations in order to improve overall functional mobility and facilitate independence with BADL participation. DC plan remains appropriate, will follow acutely per POC.     Follow Up Recommendations  SNF;Supervision - Intermittent    Equipment Recommendations  None recommended by OT    Recommendations for Other Services      Precautions / Restrictions Precautions Precautions: Fall;Other (comment) Precaution Comments: chest tube Restrictions Weight Bearing Restrictions: No       Mobility Bed Mobility Overal bed mobility: Needs Assistance Bed Mobility: Rolling Rolling: Min assist         General bed mobility comments: MIN A to roll fully to sidelying for pericare    Transfers                 General transfer comment: NT d/t incontinence    Balance                                           ADL either performed or assessed with clinical judgement   ADL Overall ADL's : Needs assistance/impaired                             Toileting- Clothing Manipulation and Hygiene: Total assistance;Bed level Toileting - Clothing Manipulation Details (indicate cue type and reason): for posterior pericare d/t  incontinent BM     Functional mobility during ADLs: Minimal assistance (rolling in bed only) General ADL Comments: session limited by episode of incontinence this session with pt needing MIN A to roll R<>L in bed ( reports increased pain in rolling to pts L side), and total A for posterior pericare     Vision       Perception     Praxis      Cognition Arousal/Alertness: Awake/alert;Lethargic (pt is awake when speakign to her but then returns to sleeping when not stimulated) Behavior During Therapy: WFL for tasks assessed/performed Overall Cognitive Status: No family/caregiver present to determine baseline cognitive functioning                                 General Comments: pt reports shes "15" and is pleasantly confused during session        Exercises     Shoulder Instructions       General Comments      Pertinent Vitals/ Pain       Pain Assessment: Faces Faces Pain Scale: Hurts little more Pain Location: back when rolling to L side Pain Descriptors / Indicators: Aching;Discomfort;Moaning Pain Intervention(s): Monitored during session;Repositioned;Limited activity within patient's tolerance  Home Living  Prior Functioning/Environment              Frequency  Min 2X/week        Progress Toward Goals  OT Goals(current goals can now be found in the care plan section)  Progress towards OT goals: Progressing toward goals  Acute Rehab OT Goals OT Goal Formulation: Patient unable to participate in goal setting Time For Goal Achievement: 03/16/21 Potential to Achieve Goals: Good  Plan Discharge plan remains appropriate;Frequency remains appropriate    Co-evaluation                 AM-PAC OT "6 Clicks" Daily Activity     Outcome Measure   Help from another person eating meals?: A Little Help from another person taking care of personal grooming?: A Lot Help from another  person toileting, which includes using toliet, bedpan, or urinal?: Total Help from another person bathing (including washing, rinsing, drying)?: A Lot Help from another person to put on and taking off regular upper body clothing?: A Lot Help from another person to put on and taking off regular lower body clothing?: Total 6 Click Score: 11    End of Session    OT Visit Diagnosis: Unsteadiness on feet (R26.81);Muscle weakness (generalized) (M62.81)   Activity Tolerance Patient tolerated treatment well   Patient Left in bed;with call bell/phone within reach;with bed alarm set   Nurse Communication Mobility status;Other (comment) (needs new purewick, had BM)        Time: 2355-7322 OT Time Calculation (min): 21 min  Charges: OT General Charges $OT Visit: 1 Visit OT Treatments $Self Care/Home Management : 8-22 mins  Karen Dyer., COTA/L Acute Rehabilitation Services [phone]    Karen Dyer 03/04/2021, 11:37 AM

## 2021-03-04 NOTE — Progress Notes (Signed)
PROGRESS NOTE  Karen Dyer [id number]:[id number] DOB: 09-Apr-1941 DOA: 02/26/2021 PCP: Patient, No Pcp Per   LOS: 6 days   Brief Narrative / Interim history: Karen Dyer a 80 y.o.femalewith medical history significant fordementia, hypertension, diastolic CHF, GERD, osteoarthritis, anemia who was sent to [facility] emergency room after a syncopal event at a skilled nursing facility. Patient was scheduled to have a blood transfusion at [facility] yesterday but had a syncopal event prior to going for the transfusion. Is not clear why she was going to the transfusion.  She was noted to be hypoxic and had a CT of the chest abdomen pelvis which showed a large right-sided empyema.  She was transferred to [facility], cardiothoracic surgery consulted, status post chest tube placement by IR on 3/13.  Subjective / 24h Interval events: She is in bed, tells me "I am fine".  Denies any chest pain, denies any shortness of breath  Assessment & Plan: Principal Problem Right-sided empyema, status post chest tube placement by IR on 3/13 -Appreciate PCCM follow-up and tube management, currently getting pleural fibrinolysis for a few days and then DC tube. -Continue IV antibiotics, follow cultures.  Change to Augmentin on discharge for total of 4 weeks  Active Problems Presumptive UTI -Cultures pending, no growth.  On broad-spectrum antibiotics.  Syncope, likely secondary to orthostatic hypotension -Unable to provide history due to underlying dementia, she was orthostatic.  Acute blood loss anemia -Received 2 units of packed red blood cells for hemoglobin of 6.5.  Hemoglobin improved appropriately and still stable this morning  Physical debility, underlying dementia -Needs SNF upon discharge  Scheduled Meds: . ALPRAZolam  0.5 mg Oral q morning  . alteplase (TPA) for intrapleural administration  10 mg Intrapleural Daily   And  . pulmozyme (DORNASE) for intrapleural administration   5 mg Intrapleural Daily  . lamoTRIgine  200 mg Oral q morning  . mupirocin ointment  1 application Nasal BID  . pantoprazole  40 mg Oral BID  . polyethylene glycol  17 g Oral QODAY  . potassium chloride  40 mEq Oral Once  . potassium chloride  40 mEq Oral Once  . QUEtiapine  37.5 mg Oral QHS   Continuous Infusions: . piperacillin-tazobactam (ZOSYN)  IV 3.375 g (03/04/21 0043)   PRN Meds:.acetaminophen, traMADol  Diet Orders (From admission, onward)    Start     Ordered   02/28/21 1421  Diet regular Room service appropriate? No; Fluid consistency: Thin  Diet effective now       Question Answer Comment  Room service appropriate? No   Fluid consistency: Thin      02/28/21 1420          DVT prophylaxis: SCDs Start: 02/27/21 0125     Code Status: Full Code  Family Communication: We will call nephew in the hour interval that he mentioned  Status is: Inpatient  Remains inpatient appropriate because:Inpatient level of care appropriate due to severity of illness   Dispo:  Patient From: [place]  Planned Disposition: [place]  Medically stable for discharge: No     Level of care: Telemetry Cardiac  Consultants:  Cardiothoracic surgery IR  Procedures:  Chest tube placement 3/13  Microbiology  Cultures - pending, NGTD  Antimicrobials: Vancomycin / Zosyn 3/12 >>    Objective: Vitals:   03/02/21 1118 03/03/21 2000 03/04/21 0100 03/04/21 0530  BP: (!) 110/59 (!) 146/65 (!) 157/67 135/64  Pulse:  (!) 110 95 98  Resp: 18 20  18 18  Temp: 98.7 F (37.1 C) 98.4 F (36.9 C) 99.5 F (37.5 C) 99 F (37.2 C)  TempSrc: Oral Oral Oral Oral  SpO2: 96% 96% 97% 97%  Weight:      Height:        Intake/Output Summary (Last 24 hours) at 03/04/2021 0851 Last data filed at 03/04/2021 0530 Gross per 24 hour  Intake 50 ml  Output 20 ml  Net 30 ml   Filed Weights   02/26/21 2202  Weight: 69.7 kg    Examination:  Constitutional:  NAD Eyes: lids and conjunctivae normal, no scleral icterus ENMT: mmm Neck: normal, supple Respiratory: clear to auscultation bilaterally, no wheezing, no crackles. Normal respiratory effort.  Cardiovascular: Regular rate and rhythm, no murmurs / rubs / gallops. No LE edema. Abdomen: soft, no distention, no tenderness. Bowel sounds positive.  Skin: no rashes Neurologic: no focal deficits, equal strength  Data Reviewed: I have independently reviewed following labs and imaging studies   CBC: Recent Labs  Lab 02/27/21 0130 02/28/21 0648 03/01/21 0632 03/02/21 0200 03/03/21 0407 03/04/21 0414  WBC 12.8* 13.1* 10.1 9.2 9.0 7.6  NEUTROABS 9.2* 8.9*  --   --   --   --   HGB 6.5* 9.9* 9.6* 9.1* 9.6* 9.6*  HCT 21.4* 30.3* 29.6* 29.9* 30.1* 31.3*  MCV 72.8* 75.8* 76.7* 79.7* 78.0* 78.6*  PLT 634* 625* 528* 498* 545* 831*   Basic Metabolic Panel: Recent Labs  Lab 02/27/21 1637 02/28/21 0648 03/01/21 0632 03/02/21 0200 03/03/21 0407 03/04/21 0414  NA  --  138 138 137 139 138  K 3.8 4.7 3.6 3.6 3.3* 3.2*  CL  --  105 106 104 106 104  CO2  --  26 25 25 24 26   GLUCOSE  --  94 95 72 107* 101*  BUN  --  9 12 14 15 9   CREATININE  --  0.68 0.70 0.69 0.65 0.58  CALCIUM  --  8.9 8.5* 8.4* 8.5* 8.5*  MG 2.0 2.0  --   --   --   --   PHOS 3.0 2.8  --   --   --   --    Liver Function Tests: Recent Labs  Lab 02/27/21 0130  AST 15  ALT 10  ALKPHOS 84  BILITOT 0.7  PROT 6.1*  ALBUMIN 2.0*   Coagulation Profile: No results for input(s): INR, PROTIME in the last 168 hours. HbA1C: No results for input(s): HGBA1C in the last 72 hours. CBG: Recent Labs  Lab 03/01/21 2125 03/02/21 0743 03/02/21 1141 03/02/21 1608 03/04/21 0730  GLUCAP 107* 94 142* 172* 97    Recent Results (from the past 240 hour(s))  MRSA PCR Screening     Status: Abnormal   Collection Time: 02/27/21  1:25 AM   Specimen: Nasal Mucosa; Nasopharyngeal  Result Value Ref Range Status   MRSA by PCR POSITIVE  (A) NEGATIVE Final    Comment: CRITICAL RESULT CALLED TO, READ BACK BY AND VERIFIED WITH: RN, Tamala Ser [id number] @0448  THANEY Performed at [facility] [address], [place], [place] [address]   Culture, Urine     Status: None   Collection Time: 02/27/21  5:55 AM   Specimen: Urine, Random  Result Value Ref Range Status   Specimen Description URINE, RANDOM  Final   Special Requests NONE  Final   Culture   Final    NO GROWTH Performed at [facility], [place],  [place] [address]    Report Status 03/03/2021 FINAL  Final  Culture, blood (routine x 2)     Status: None   Collection Time: 02/27/21  4:37 PM   Specimen: BLOOD RIGHT HAND  Result Value Ref Range Status   Specimen Description BLOOD RIGHT HAND  Final   Special Requests   Final    BOTTLES DRAWN AEROBIC AND ANAEROBIC Blood Culture adequate volume   Culture   Final    NO GROWTH 5 DAYS Performed at [facility], [place] [address], [place], [place] [address]    Report Status 03/04/2021 FINAL  Final  Culture, blood (routine x 2)     Status: None   Collection Time: 02/27/21  4:48 PM   Specimen: BLOOD LEFT HAND  Result Value Ref Range Status   Specimen Description BLOOD LEFT HAND  Final   Special Requests   Final    BOTTLES DRAWN AEROBIC AND ANAEROBIC Blood Culture adequate volume   Culture   Final    NO GROWTH 5 DAYS Performed at [facility], [place] [address], [place], [place] [address]    Report Status 03/04/2021 FINAL  Final  Aerobic/Anaerobic Culture (surgical/deep wound)     Status: None (Preliminary result)   Collection Time: 02/28/21  1:04 PM   Specimen: Abscess  Result Value Ref Range Status   Specimen Description ABSCESS  Final   Special Requests EMPYEMA  Final   Gram Stain   Final    ABUNDANT WBC PRESENT,BOTH PMN AND MONONUCLEAR NO ORGANISMS SEEN    Culture   Final    NO GROWTH 3 DAYS NO ANAEROBES ISOLATED; CULTURE IN PROGRESS FOR 5 DAYS Performed at [place], [place] [address], [place], [place] [address]    Report Status PENDING  Incomplete     Radiology Studies: DG Chest 1 View  Result Date: 03/04/2021 CLINICAL DATA:  Chest tube in place EXAM: CHEST  1 VIEW COMPARISON:  March 02, 2021 FINDINGS: Pigtail catheter again noted on the right with apparent kink in the tube, stable. No pneumothorax evident. Partially loculated pleural effusion on the right with ill-defined airspace opacity in the right base persists. There is a small area of airspace opacity in the medial left base. Left lung otherwise clear. Heart is upper normal in size with pulmonary vascularity normal. No adenopathy. No bone lesions. IMPRESSION: Pigtail catheter again noted on the right with apparent kink in this catheter. There is a loculated pleural effusion on the right with atelectasis and potential superimposed pneumonia right base. Ill-defined opacity medial left base concerning for focus of pneumonia. Left lung otherwise clear. Stable cardiac prominence. Aortic Atherosclerosis (ICD10-I70.0). Electronically Signed   By: Lowella Grip III M.D.   On: 03/04/2021 08:25   DG Wrist 2 Views Right  Result Date: 03/03/2021 CLINICAL DATA:  Wrist pain after fall EXAM: RIGHT WRIST - 2 VIEW COMPARISON:  09/28/2020 FINDINGS: No acute displaced fracture or malalignment. Chronic fracture deformity of the distal radius. Soft tissues are unremarkable. IMPRESSION: No acute osseous abnormality. Electronically Signed   By: Donavan Foil M.D.   On: 03/03/2021 21:25     Marzetta Board, MD, PhD [facility]  Between 7 am - 7 pm I am available, please contact me via Amion or Securechat  Between 7 pm - 7 am I am not available, please contact night coverage MD/APP via Amion

## 2021-03-04 NOTE — Consult Note (Addendum)
   NAME:  Tanisia Yokley, MRN:  [id number], DOB:  1941-01-31, LOS: 24 ADMISSION DATE:  02/26/2021, CONSULTATION DATE:  03/03/21 REFERRING MD:  Orvan Seen, CHIEF COMPLAINT:  none   Brief History   80 year old SNF resident with advanced dementia presenting with empyema.  History of present illness   80 year old SNF resident with advanced dementia presenting with empyema.  Medical history as below.  All questions I ask she states "I'm fine."  Uncooperative with exam and yells at me when attempted.  History per chart review.  Had ?syncopal event and hypoxemia at [facility], transferred here after CT showed emypema.  Pigtail placed 3 days ago.  Not a decortication candidate. PCCM consulted to help manage empyema medically.  Past Medical History  HFPEF GERD OA Anemia  Significant Hospital Events   3/13 pigtail inserted by IR  Consults:  TCTS  Procedures:  3/13 pigtail inserted by IR  Significant Diagnostic Tests:  CXR: R empyema  Micro Data:  fluid  Antimicrobials:  Vanc    Interim history/subjective:  [place] out of chair yesterday. Minimal chest tube output. Denies SOB.  Objective   Blood pressure 135/64, pulse 98, temperature 99 F (37.2 C), temperature source Oral, resp. rate 18, height 5\' 6"  (1.676 m), weight 69.7 kg, SpO2 97 %.        Intake/Output Summary (Last 24 hours) at 03/04/2021 0810 Last data filed at 03/04/2021 0530 Gross per 24 hour  Intake 50 ml  Output 20 ml  Net 30 ml   Filed Weights   02/26/21 2202  Weight: 69.7 kg    Examination: Elderly lady in NAD laying in bed Lung sounds diminished R side Pigtail in place without tidaling, serous fluid in atrium AO to self  Labs with stable thrombocytosis CXR unchanged loculated R effusion  Resolved Hospital Problem list   n/a  Assessment & Plan:  -Acute respiratory failure and syncope of unclear origin resolved -R empyema with negative cultures, question if this a chronic process; not surgical decortication  candidate -Advanced dementia with behavioral disturbance -Muscular deconditioning  - TPA/dornase again today, if minimal output would probably just pull tube since this is asymptomatic, has poor baseline performance status - Chest tube to -20cmH2O - Would do zosyn or augmentin x 4 weeks and repeat imaging in [place] - Will follow with you  Best practice:  Per primary   GOC (weekly)  Per primary  Erskine Emery MD PCCM

## 2021-03-04 NOTE — Procedures (Signed)
Pleural Fibrinolytic Administration Procedure Note  Karen Dyer  [id number]  07/25/1941  Date:03/04/21  Time:9:33 AM   Provider Performing:Simpson Paulos C Tamala Julian   Procedure: Pleural Fibrinolysis Subsequent day [phone])  Indication(s) Fibrinolysis of complicated pleural effusion  Consent Risks of the procedure as well as the alternatives and risks of each were explained to the patient and/or caregiver.  Consent for the procedure was obtained.   Anesthesia None   Time Out Verified patient identification, verified procedure, site/side was marked, verified correct patient position, special equipment/implants available, medications/allergies/relevant history reviewed, required imaging and test results available.   Sterile Technique Hand hygiene, gloves   Procedure Description Existing pleural catheter was cleaned and accessed in sterile manner.  10mg  of tPA in 30cc of saline and 5mg  of dornase in 30cc of sterile water were injected into pleural space using existing pleural catheter.  Catheter will be clamped for 1 hour and then placed back to suction.   Complications/Tolerance None; patient tolerated the procedure well.  EBL None   Specimen(s) None

## 2021-03-04 NOTE — Progress Notes (Signed)
Physical Therapy Treatment Patient Details Name: Karen Dyer MRN: [id number] DOB: 08/15/1941 Today's Date: 03/04/2021    History of Present Illness 80 yo admitted 3/11 from SNF with syncopal episode. Pt with Rt empyema and anemia. PMhx: dementia, HTN, dCHF, GERD, osteoarthritis, anemia    PT Comments    Pt in bed and agreeable to participate with therapy. She was able to progress OOB to short distance ambulation in hallway. Distance limited by fatigue and pain from chest tube side. Pt coughing with transition to upright position. She is pleasantly confused and follows commands well. Will continue to follow acutely.     Follow Up Recommendations  SNF     Equipment Recommendations  None recommended by PT    Recommendations for Other Services       Precautions / Restrictions Precautions Precautions: Fall;Other (comment) Precaution Comments: chest tube Restrictions Weight Bearing Restrictions: No    Mobility  Bed Mobility Overal bed mobility: Needs Assistance Bed Mobility: Supine to Sit Rolling: Min assist   Supine to sit: Min assist     General bed mobility comments: min A with use of bed rail secondary to pain from chest tube.    Transfers Overall transfer level: Needs assistance   Transfers: Sit to/from Stand Sit to Stand: Min assist         General transfer comment: min A to rise from EOB.  Ambulation/Gait Ambulation/Gait assistance: Min guard;Min assist Gait Distance (Feet): 50 Feet Assistive device: Rolling walker (2 wheeled) Gait Pattern/deviations: Step-through pattern;Decreased stride length;Trunk flexed     General Gait Details: mostly min guard with min A for RW management during turns. Cues for direction and safety.   Stairs             Wheelchair Mobility    Modified Rankin (Stroke Patients Only)       Balance Overall balance assessment: Needs assistance   Sitting balance-Leahy Scale: Good Sitting balance - Comments: EOB    Standing balance support: Bilateral upper extremity supported;No upper extremity supported;During functional activity Standing balance-Leahy Scale: Fair Standing balance comment: RW for stability                            Cognition Arousal/Alertness: Awake/alert;Lethargic (pt is awake when speakign to her but then returns to sleeping when not stimulated) Behavior During Therapy: WFL for tasks assessed/performed Overall Cognitive Status: No family/caregiver present to determine baseline cognitive functioning                                 General Comments: pleasantly confused. Difficult to understand at times, but following commands well.      Exercises      General Comments        Pertinent Vitals/Pain Pain Assessment: Faces Faces Pain Scale: Hurts little more Pain Location: Chest tube site Pain Descriptors / Indicators: Aching;Discomfort;Moaning Pain Intervention(s): Monitored during session;Limited activity within patient's tolerance;Repositioned    Home Living                      Prior Function            PT Goals (current goals can now be found in the care plan section) Acute Rehab PT Goals Patient Stated Goal: return home and sleep PT Goal Formulation: With patient Time For Goal Achievement: 03/14/21 Potential to Achieve Goals: Fair Progress towards PT goals: Progressing toward  goals    Frequency    Min 2X/week      PT Plan Current plan remains appropriate    Co-evaluation              AM-PAC PT "6 Clicks" Mobility   Outcome Measure  Help needed turning from your back to your side while in a flat bed without using bedrails?: A Little Help needed moving from lying on your back to sitting on the side of a flat bed without using bedrails?: A Little Help needed moving to and from a bed to a chair (including a wheelchair)?: A Little Help needed standing up from a chair using your arms (e.g., wheelchair or bedside  chair)?: A Little Help needed to walk in hospital room?: A Little Help needed climbing 3-5 steps with a railing? : A Lot 6 Click Score: 17    End of Session   Activity Tolerance: Patient tolerated treatment well Patient left: in chair;with call bell/phone within reach;with chair alarm set Nurse Communication: Mobility status PT Visit Diagnosis: Other abnormalities of gait and mobility (R26.89);Difficulty in walking, not elsewhere classified (R26.2)     Time: 1350-1416 PT Time Calculation (min) (ACUTE ONLY): 26 min  Charges:  $Gait Training: 23-37 mins                     Benjiman Core, [place] Pager [phone] Acute Rehab   Allena Katz 03/04/2021, 2:25 PM

## 2021-03-05 ENCOUNTER — Inpatient Hospital Stay (HOSPITAL_COMMUNITY): Payer: Medicare Other

## 2021-03-05 DIAGNOSIS — J869 Pyothorax without fistula: Secondary | ICD-10-CM | POA: Diagnosis not present

## 2021-03-05 DIAGNOSIS — Z9689 Presence of other specified functional implants: Secondary | ICD-10-CM

## 2021-03-05 LAB — GLUCOSE, CAPILLARY
Glucose-Capillary: 91 mg/dL (ref 70–99)
Glucose-Capillary: 117 mg/dL — ABNORMAL HIGH (ref 70–99)
Glucose-Capillary: 78 mg/dL (ref 70–99)

## 2021-03-05 LAB — AEROBIC/ANAEROBIC CULTURE W GRAM STAIN (SURGICAL/DEEP WOUND): Culture: NO GROWTH

## 2021-03-05 MED ORDER — SODIUM CHLORIDE (PF) 0.9 % IJ SOLN
10.0000 mg | Freq: Once | INTRAMUSCULAR | Status: DC
  Filled 2021-03-05: qty 10

## 2021-03-05 MED ORDER — STERILE WATER FOR INJECTION IJ SOLN
5.0000 mg | Freq: Once | RESPIRATORY_TRACT | Status: DC
  Filled 2021-03-05: qty 5

## 2021-03-05 NOTE — Procedures (Signed)
Pleural Fibrinolytic Administration Procedure Note  Karen Dyer  [id number]  May 16, 1941  Date:03/05/21  Time:11:30AM  Provider Performing:Jaxten Brosh Shearon Stalls   Procedure: Pleural Fibrinolysis Subsequent day [phone])  Indication(s) Fibrinolysis of complicated pleural effusion  Consent Risks of the procedure as well as the alternatives and risks of each were explained to the patient and/or caregiver.  Consent for the procedure was obtained.   Anesthesia None   Time Out Verified patient identification, verified procedure, site/side was marked, verified correct patient position, special equipment/implants available, medications/allergies/relevant history reviewed, required imaging and test results available.   Sterile Technique Hand hygiene, gloves   Procedure Description Existing pleural catheter was cleaned and accessed in sterile manner.  10mg  of tPA in 30cc of saline and 5mg  of dornase in 30cc of sterile water were injected into pleural space using existing pleural catheter.  Catheter will be clamped for 1 hour and then placed back to suction.   Complications/Tolerance None; patient tolerated the procedure well.  EBL None   Specimen(s) None   Of note, late note entry.  Procedure performed at 11:30AM.  RN instructed for 1 hour dwell time before unclamping and turning stop cock back to allow chest tube to be placed back on suction.   [name], [place] [facility] For pager details, please see AMION If no response to pager, please call [phone] until 7:00 PM After 7:00 PM, please call Elink at [phone] 03/05/2021, 4:03 PM  g

## 2021-03-05 NOTE — Progress Notes (Signed)
PROGRESS NOTE  Karen Dyer YKD:[id number] DOB: 08-04-1941 DOA: 02/26/2021 PCP: Patient, No Pcp Per   LOS: 7 days   Brief Narrative / Interim history: Karen Dyer a 80 y.o.femalewith medical history significant fordementia, hypertension, diastolic CHF, GERD, osteoarthritis, anemia who was sent to [facility] emergency room after a syncopal event at a skilled nursing facility. Patient was scheduled to have a blood transfusion at [facility] yesterday but had a syncopal event prior to going for the transfusion. Is not clear why she was going to the transfusion.  She was noted to be hypoxic and had a CT of the chest abdomen pelvis which showed a large right-sided empyema.  She was transferred to [facility], cardiothoracic surgery consulted, status post chest tube placement by IR on 3/13.  Subjective / 24h Interval events: She is more pleasant this morning, no complaints.  Mild soreness at the chest tube site but that is minimal.  Assessment & Plan: Principal Problem Right-sided empyema, status post chest tube placement by IR on 3/13 -Appreciate PCCM follow-up and tube management, currently getting pleural fibrinolysis for a few days and then DC tube, hopefully on 3/19 -Continue IV antibiotics with Zosyn, follow cultures.  Change to Augmentin on discharge for total of 4 weeks  Active Problems Presumptive UTI -Cultures pending, no growth.  On broad-spectrum antibiotics.  Syncope, likely secondary to orthostatic hypotension -Unable to provide history due to underlying dementia, she was orthostatic.  Acute blood loss anemia -Received 2 units of packed red blood cells for hemoglobin of 6.5.  Hemoglobin improved appropriately, stable  Physical debility, underlying dementia -Needs SNF upon discharge, will return to her prior living situation  Scheduled Meds: . ALPRAZolam  0.5 mg Oral q morning  . alteplase (TPA) for intrapleural administration  10 mg Intrapleural  Daily   And  . pulmozyme (DORNASE) for intrapleural administration  5 mg Intrapleural Daily  . alteplase (TPA) for intrapleural administration  10 mg Intrapleural Once   And  . pulmozyme (DORNASE) for intrapleural administration  5 mg Intrapleural Once  . lamoTRIgine  200 mg Oral q morning  . pantoprazole  40 mg Oral BID  . polyethylene glycol  17 g Oral QODAY  . potassium chloride  40 mEq Oral Once  . QUEtiapine  37.5 mg Oral QHS   Continuous Infusions: . piperacillin-tazobactam (ZOSYN)  IV 3.375 g (03/05/21 0128)   PRN Meds:.acetaminophen, traMADol  Diet Orders (From admission, onward)    Start     Ordered   02/28/21 1421  Diet regular Room service appropriate? No; Fluid consistency: Thin  Diet effective now       Question Answer Comment  Room service appropriate? No   Fluid consistency: Thin      02/28/21 1420          DVT prophylaxis: SCDs Start: 02/27/21 0125     Code Status: Full Code  Family Communication: Updated nephew over the phone 3/17  Status is: Inpatient  Remains inpatient appropriate because:Inpatient level of care appropriate due to severity of illness   Dispo:  Patient From: [place]  Planned Disposition: [place]  Medically stable for discharge: No     Level of care: Telemetry Cardiac  Consultants:  Cardiothoracic surgery IR  Procedures:  Chest tube placement 3/13  Microbiology  Cultures - pending, NGTD  Antimicrobials: Vancomycin / Zosyn 3/12 >>    Objective: Vitals:   03/04/21 0100 03/04/21 0530 03/04/21 1350 03/04/21 2041  BP: (!) 157/67 135/64 (!) 176/56 Marland Kitchen)  131/51  Pulse: 95 98 93 99  Resp: 18 18 16 18   Temp: 99.5 F (37.5 C) 99 F (37.2 C) 98.2 F (36.8 C) 98.1 F (36.7 C)  TempSrc: Oral Oral Oral   SpO2: 97% 97% 98%   Weight:      Height:        Intake/Output Summary (Last 24 hours) at 03/05/2021 6967 Last data filed at 03/04/2021 2120 Gross per 24 hour  Intake 753.2 ml  Output  700 ml  Net 53.2 ml   Filed Weights   02/26/21 2202  Weight: 69.7 kg    Examination:  Constitutional: In no distress, in bed Eyes: No scleral icterus ENMT: mmm Neck: normal, supple Respiratory: Lungs are clear bilaterally without wheezing or crackles Cardiovascular: Regular rate and rhythm, no murmurs, no edema Abdomen: Soft, NT, ND, bowel sounds positive Skin: No rashes seen Neurologic: Nonfocal  Data Reviewed: I have independently reviewed following labs and imaging studies   CBC: Recent Labs  Lab 02/27/21 0130 02/28/21 0648 03/01/21 0632 03/02/21 0200 03/03/21 0407 03/04/21 0414  WBC 12.8* 13.1* 10.1 9.2 9.0 7.6  NEUTROABS 9.2* 8.9*  --   --   --   --   HGB 6.5* 9.9* 9.6* 9.1* 9.6* 9.6*  HCT 21.4* 30.3* 29.6* 29.9* 30.1* 31.3*  MCV 72.8* 75.8* 76.7* 79.7* 78.0* 78.6*  PLT 634* 625* 528* 498* 545* 893*   Basic Metabolic Panel: Recent Labs  Lab 02/27/21 1637 02/28/21 0648 03/01/21 0632 03/02/21 0200 03/03/21 0407 03/04/21 0414  NA  --  138 138 137 139 138  K 3.8 4.7 3.6 3.6 3.3* 3.2*  CL  --  105 106 104 106 104  CO2  --  26 25 25 24 26   GLUCOSE  --  94 95 72 107* 101*  BUN  --  9 12 14 15 9   CREATININE  --  0.68 0.70 0.69 0.65 0.58  CALCIUM  --  8.9 8.5* 8.4* 8.5* 8.5*  MG 2.0 2.0  --   --   --   --   PHOS 3.0 2.8  --   --   --   --    Liver Function Tests: Recent Labs  Lab 02/27/21 0130  AST 15  ALT 10  ALKPHOS 84  BILITOT 0.7  PROT 6.1*  ALBUMIN 2.0*   Coagulation Profile: No results for input(s): INR, PROTIME in the last 168 hours. HbA1C: No results for input(s): HGBA1C in the last 72 hours. CBG: Recent Labs  Lab 03/02/21 1608 03/04/21 0730 03/04/21 1132 03/04/21 1620 03/05/21 0722  GLUCAP 172* 97 100* 89 91    Recent Results (from the past 240 hour(s))  MRSA PCR Screening     Status: Abnormal   Collection Time: 02/27/21  1:25 AM   Specimen: Nasal Mucosa; Nasopharyngeal  Result Value Ref Range Status   MRSA by PCR POSITIVE  (A) NEGATIVE Final    Comment: CRITICAL RESULT CALLED TO, READ BACK BY AND VERIFIED WITH: RNTamala Ser [id number] @0448  THANEY Performed at [facility] [address], [place], [place] [address]   Culture, Urine     Status: None   Collection Time: 02/27/21  5:55 AM   Specimen: Urine, Random  Result Value Ref Range Status   Specimen Description URINE, RANDOM  Final   Special Requests NONE  Final   Culture   Final    NO GROWTH Performed at [facility], [place] [address] [place], [place] [address]  Report Status 03/03/2021 FINAL  Final  Culture, blood (routine x 2)     Status: None   Collection Time: 02/27/21  4:37 PM   Specimen: BLOOD RIGHT HAND  Result Value Ref Range Status   Specimen Description BLOOD RIGHT HAND  Final   Special Requests   Final    BOTTLES DRAWN AEROBIC AND ANAEROBIC Blood Culture adequate volume   Culture   Final    NO GROWTH 5 DAYS Performed at [facility], [place] [address], [place], [place] [address]    Report Status 03/04/2021 FINAL  Final  Culture, blood (routine x 2)     Status: None   Collection Time: 02/27/21  4:48 PM   Specimen: BLOOD LEFT HAND  Result Value Ref Range Status   Specimen Description BLOOD LEFT HAND  Final   Special Requests   Final    BOTTLES DRAWN AEROBIC AND ANAEROBIC Blood Culture adequate volume   Culture   Final    NO GROWTH 5 DAYS Performed at [facility], [place] [address], [place], [place] [address]    Report Status 03/04/2021 FINAL  Final  Aerobic/Anaerobic Culture (surgical/deep wound)     Status: None (Preliminary result)   Collection Time: 02/28/21  1:04 PM   Specimen: Abscess  Result Value Ref Range Status   Specimen Description ABSCESS  Final   Special Requests EMPYEMA  Final   Gram Stain   Final    ABUNDANT WBC PRESENT,BOTH PMN AND MONONUCLEAR NO ORGANISMS SEEN    Culture   Final    NO GROWTH 4 DAYS NO ANAEROBES ISOLATED; CULTURE IN PROGRESS FOR 5 DAYS Performed at [place], [place] [address] [place],  [address]    Report Status PENDING  Incomplete     Radiology Studies: DG Chest 1 View  Result Date: 03/05/2021 CLINICAL DATA:  Empyema, chest tube EXAM: CHEST  1 VIEW COMPARISON:  Portable exam 0613 hours compared to 03/04/2021 FINDINGS: Pigtail RIGHT thoracostomy tube unchanged. Upper normal heart size. Pulmonary vascularity normal. LEFT paraspinal density at inferior chest likely a small hiatal hernia, seen on a prior CT. Volume loss in RIGHT hemithorax with atelectasis and pleural effusion at inferior RIGHT hemithorax unchanged. Remaining lungs clear. Skin folds project over LEFT lung. No pneumothorax or acute osseous findings. IMPRESSION: Persistent atelectasis and pleural effusion at RIGHT lung base. Electronically Signed   By: Lavonia Dana M.D.   On: 03/05/2021 08:30     Marzetta Board, MD, PhD [facility]  Between 7 am - 7 pm I am available, please contact me via Amion or Securechat  Between 7 pm - 7 am I am not available, please contact night coverage MD/APP via Amion

## 2021-03-05 NOTE — Progress Notes (Signed)
   NAME:  Karen Dyer, MRN:  [id number], DOB:  1941/02/07, LOS: 7 ADMISSION DATE:  02/26/2021, CONSULTATION DATE:  03/03/21 REFERRING MD:  Orvan Seen, CHIEF COMPLAINT:  none   Brief History   80 year old SNF resident with advanced dementia presenting with empyema.  History of present illness   80 year old SNF resident with advanced dementia presenting with empyema.  Medical history as below.  All questions I ask she states "I'm fine."  Uncooperative with exam and yells at me when attempted.  History per chart review.  Had ?syncopal event and hypoxemia at [facility], transferred here after CT showed emypema.  Pigtail placed 3 days ago.  Not a decortication candidate. PCCM consulted to help manage empyema medically.  Past Medical History  HFPEF GERD OA Anemia  Significant Hospital Events   3/13 pigtail inserted by IR  Consults:  TCTS  Procedures:  3/13 pigtail inserted by IR  Significant Diagnostic Tests:  CXR: R empyema  Micro Data:  fluid  Antimicrobials:  Vanc    Interim history/subjective:  300cc output from chest tube past 24 hours after tPA / DNAse. Complains of discomfort at tube insertion site this AM. No breathing difficulties.  Objective   Blood pressure (!) 131/51, pulse 99, temperature 98.1 F (36.7 C), resp. rate 18, height 5\' 6"  (1.676 m), weight 69.7 kg, SpO2 98 %.        Intake/Output Summary (Last 24 hours) at 03/05/2021 0934 Last data filed at 03/04/2021 2120 Gross per 24 hour  Intake 753.2 ml  Output 700 ml  Net 53.2 ml   Filed Weights   02/26/21 2202  Weight: 69.7 kg    Examination: General: Elderly female, resting in bed, in NAD. Neuro: A&O x 3 but intermittently confused and repeats sentences. HEENT: [place]/AT. Sclerae anicteric. EOMI. Cardiovascular: RRR, no M/R/G.  Lungs: Respirations even and unlabored.  CTA bilaterally though slightly diminished R base.  R pigtail chest tube without air leak. Abdomen: BS x 4, soft, NT/ND.  Musculoskeletal: No  gross deformities, no edema.  Skin: Intact, warm, no rashes.    Resolved Hospital Problem list   n/a  Assessment & Plan:  -Acute respiratory failure and syncope of unclear origin resolved -R empyema with negative cultures, question if this a chronic process; not surgical decortication candidate -Advanced dementia with behavioral disturbance -Muscular deconditioning  - TPA/dornase once more today then can consider pulling chest tube 3/19 depending on output (only 300cc past 24 hrs after tPA / DNAse yesterday). - Chest tube to -20cmH2O. - Would do zosyn or augmentin x 4 weeks and repeat imaging in . - Mobilize and out of bed as able with assistance.   [name], [place] [facility] For pager details, please see AMION If no response to pager, please call [phone] until 7:00 PM After 7:00 PM, please call Elink at [phone] 03/05/2021, 9:40 AM

## 2021-03-06 ENCOUNTER — Inpatient Hospital Stay (HOSPITAL_COMMUNITY): Payer: Medicare Other

## 2021-03-06 DIAGNOSIS — J869 Pyothorax without fistula: Secondary | ICD-10-CM | POA: Diagnosis not present

## 2021-03-06 LAB — CBC
HCT: 34.4 % — ABNORMAL LOW (ref 36.0–46.0)
Hemoglobin: 10.5 g/dL — ABNORMAL LOW (ref 12.0–15.0)
MCH: 24.2 pg — ABNORMAL LOW (ref 26.0–34.0)
MCHC: 30.5 g/dL (ref 30.0–36.0)
MCV: 79.3 fL — ABNORMAL LOW (ref 80.0–100.0)
Platelets: 614 10*3/uL — ABNORMAL HIGH (ref 150–400)
RBC: 4.34 MIL/uL (ref 3.87–5.11)
RDW: 21.5 % — ABNORMAL HIGH (ref 11.5–15.5)
WBC: 6.8 10*3/uL (ref 4.0–10.5)
nRBC: 0 % (ref 0.0–0.2)

## 2021-03-06 LAB — BASIC METABOLIC PANEL
Anion gap: 8 (ref 5–15)
BUN: 8 mg/dL (ref 8–23)
CO2: 26 mmol/L (ref 22–32)
Calcium: 8.6 mg/dL — ABNORMAL LOW (ref 8.9–10.3)
Chloride: 101 mmol/L (ref 98–111)
Creatinine, Ser: 0.58 mg/dL (ref 0.44–1.00)
GFR, Estimated: >60 mL/min (ref >60)
Glucose, Bld: 90 mg/dL (ref 70–99)
Potassium: 3.6 mmol/L (ref 3.5–5.1)
Sodium: 135 mmol/L (ref 135–145)

## 2021-03-06 LAB — GLUCOSE, CAPILLARY: Glucose-Capillary: 93 mg/dL (ref 70–99)

## 2021-03-06 NOTE — NC FL2 (Signed)
[place] LEVEL OF CARE SCREENING TOOL     IDENTIFICATION  Patient Name: Karen Dyer Birthdate: 08-11-1941 Sex: female Admission Date (Current Location): 02/26/2021  [facility] [employer] and [place] Number:  Herbalist and Address:  [facility], [place] [address], [place], [place] [address]      Provider Number: [id number]  Attending Physician Name and Address:  Caren Griffins, MD  Relative Name and Phone Number:       Current Level of Care: Hospital Recommended Level of Care: [place] Prior Approval Number:    Date Approved/Denied:   PASRR Number:    Discharge Plan: SNF    Current Diagnoses: Patient Active Problem List   Diagnosis Date Noted  . Chest tube in place   . Empyema, right ([place]) 02/27/2021  . Syncope 02/27/2021  . Anemia 02/27/2021  . GI bleed 02/27/2021  . Dementia ([place]) 02/27/2021  . Essential hypertension 02/27/2021  . Hypoxia 02/27/2021    Orientation RESPIRATION BLADDER Height & Weight     Self  Normal Incontinent,External catheter Weight: 153 lb 10.6 oz (69.7 kg) Height:  5\' 6"  (167.6 cm)  BEHAVIORAL SYMPTOMS/MOOD NEUROLOGICAL BOWEL NUTRITION STATUS      Incontinent Diet (See DC summary)  AMBULATORY STATUS COMMUNICATION OF NEEDS Skin   Extensive Assist Verbally Normal                       Personal Care Assistance Level of Assistance  Bathing,Feeding,Dressing Bathing Assistance: Maximum assistance Feeding assistance: Limited assistance Dressing Assistance: Maximum assistance     Functional Limitations Info  Sight,Hearing,Speech Sight Info: Adequate Hearing Info: Adequate Speech Info: Impaired    SPECIAL CARE FACTORS FREQUENCY  PT (By licensed PT),OT (By licensed OT)     PT Frequency: 5x week OT Frequency: 5x week            Contractures Contractures Info: Not present    Additional Factors Info  Code Status,Allergies,Psychotropic Code Status Info: Full Allergies  Info: Asprin, Nuts, Oranges Psychotropic Info: Xanax, Seroquel         Current Medications (03/06/2021):  This is the current hospital active medication list Current Facility-Administered Medications  Medication Dose Route Frequency Provider Last Rate Last Admin  . acetaminophen (TYLENOL) tablet 650 mg  650 mg Oral Q6H PRN Kayleen Memos, DO   650 mg at 03/06/21 0854  . ALPRAZolam Duanne Moron) tablet 0.5 mg  0.5 mg Oral q morning Irene Pap N, DO   0.5 mg at 03/06/21 0854  . lamoTRIgine (LAMICTAL) tablet 200 mg  200 mg Oral q morning Irene Pap N, DO   200 mg at 03/06/21 0854  . pantoprazole (PROTONIX) EC tablet 40 mg  40 mg Oral BID Irene Pap N, DO   40 mg at 03/06/21 0854  . piperacillin-tazobactam (ZOSYN) IVPB 3.375 g  3.375 g Intravenous Q8H Chotiner, Yevonne Aline, MD 12.5 mL/hr at 03/06/21 0853 3.375 g at 03/06/21 0853  . polyethylene glycol (MIRALAX / GLYCOLAX) packet 17 g  17 g Oral QODAY Hall, Carole N, DO   17 g at 03/05/21 0940  . potassium chloride SA (KLOR-CON) CR tablet 40 mEq  40 mEq Oral Once Caren Griffins, MD      . QUEtiapine (SEROQUEL) tablet 37.5 mg  37.5 mg Oral QHS Hall, Carole N, DO   37.5 mg at 03/05/21 2121  . traMADol (ULTRAM) tablet 50 mg  50 mg Oral Q6H PRN Kayleen Memos, DO  50 mg at 03/02/21 4835     Discharge Medications: Please see discharge summary for a list of discharge medications.  Relevant Imaging Results:  Relevant Lab Results:   Additional Information SS# [id number], [place]

## 2021-03-06 NOTE — Progress Notes (Signed)
   NAME:  Karen Dyer, MRN:  [id number], DOB:  1941-04-02, LOS: 30 ADMISSION DATE:  02/26/2021, CONSULTATION DATE:  03/03/21 REFERRING MD:  Orvan Seen, CHIEF COMPLAINT:  none   Brief History   80 year old SNF resident with advanced dementia presenting with empyema.  History of present illness   80 year old SNF resident with advanced dementia presenting with empyema.  Medical history as below.  All questions I ask she states "I'm fine."  Uncooperative with exam and yells at me when attempted.  History per chart review.  Had ?syncopal event and hypoxemia at [facility], transferred here after CT showed emypema.  Pigtail placed 3 days ago.  Not a decortication candidate. PCCM consulted to help manage empyema medically.  Past Medical History  HFPEF GERD OA Anemia  Significant Hospital Events   3/13 pigtail inserted by IR  Consults:  TCTS  Procedures:  3/13 pigtail inserted by IR  Significant Diagnostic Tests:  CXR: R empyema  Micro Data:  fluid  Antimicrobials:  Vanc    Interim history/subjective:  3/19: 144ml output of chest tube. cxr stable if not slightly improved. On RA. Sitting up oob eating breakfast. Extremely HOH  Objective   Blood pressure (!) 145/64, pulse 78, temperature 98.6 F (37 C), temperature source Oral, resp. rate 18, height 5\' 6"  (1.676 m), weight 69.7 kg, SpO2 98 %.        Intake/Output Summary (Last 24 hours) at 03/06/2021 1108 Last data filed at 03/05/2021 1700 Gross per 24 hour  Intake --  Output 150 ml  Net -150 ml   Filed Weights   02/26/21 2202  Weight: 69.7 kg    Examination: General: Elderly female, sitting up oob in chair eating breakfast Neuro: A&O x 3 but intermittently confused and repeats sentences., extremely HOH HEENT: [place]/AT. Sclerae anicteric. EOMI. Cardiovascular: RRR, no M/R/G.  Lungs: Respirations even and unlabored.  CTA bilaterally though slightly diminished R base.  R pigtail chest tube without air leak. Abdomen: BS x 4, soft,  NT/ND.  Musculoskeletal: No gross deformities, no edema.  Skin: Intact, warm, no rashes.    Resolved Hospital Problem list   n/a  Assessment & Plan:  -Acute respiratory failure and syncope of unclear origin resolved -R empyema with negative cultures, question if this a chronic process; not surgical decortication candidate -Advanced dementia with behavioral disturbance -Muscular deconditioning  -141ml/24hr output from chest tube s/p tpa administration - chest tube clamped today. Chest xray in am and then pull if effusion not greatly changed.   - Would do zosyn or augmentin x 4 weeks and repeat imaging in [place]. - Mobilize and out of bed as able with assistance.    care time 25 mins. This represents my time independent of the NPs time taking care of the pt. This is excluding procedures.    Audria Nine DO [facility] 03/06/2021, 11:08 AM See Amion for pager If no response to pager, please call [phone] until 1900 After 1900 please call [facility]

## 2021-03-06 NOTE — TOC Initial Note (Addendum)
Transition of Care [facility]) - Initial/Assessment Note    Patient Details  Name: Karen Dyer MRN: [id number] Date of Birth: 09/15/1941  Transition of Care [facility]) CM/SW Contact:    Coralee Pesa, [place] Phone Number: 03/06/2021, 12:39 PM  Clinical Narrative:                 CSW noted that pt is disoriented at this time, reached out to nephew, Darrin, who noted that pt is a resident at [facility]. He stated that the plan would be for her to return there when ready. He noted that she had both vaccines, but was unsure if she had a booster. CSW will complete FL2 to ensure pt has everything when ready to go to facility. SW will continue to follow.  PASSR information is in the [place] MUST system incorrectly and will need to be fixed on Monday.  Expected Discharge Plan: Skilled Nursing Facility Barriers to Discharge: Continued Medical Work up   Patient Goals and CMS Choice Patient states their goals for this hospitalization and ongoing recovery are:: Pt unable to participate in goal setting due to disorientation.   Choice offered to / list presented to : [place] / Guardian  Expected Discharge Plan and Services Expected Discharge Plan: [place] Acute Care Choice: [place] Living arrangements for the past 2 months: [place]                                      Prior Living Arrangements/Services Living arrangements for the past 2 months: [place] Lives with:: Facility Resident Patient language and need for interpreter reviewed:: Yes        Need for Family Participation in Patient Care: Yes (Comment) Care giver support system in place?: Yes (comment)   Criminal Activity/Legal Involvement Pertinent to Current Situation/Hospitalization: No - Comment as needed  Activities of Daily Living Home Assistive Devices/Equipment: Environmental consultant (specify type) ADL Screening (condition at time of admission) Patient's  cognitive ability adequate to safely complete daily activities?: No Is the patient deaf or have difficulty hearing?: No Does the patient have difficulty seeing, even when wearing glasses/contacts?: No Does the patient have difficulty concentrating, remembering, or making decisions?: Yes Patient able to express need for assistance with ADLs?: Yes Does the patient have difficulty dressing or bathing?: Yes Independently performs ADLs?: No Communication: Independent Dressing (OT): Needs assistance Is this a change from baseline?: Pre-admission baseline Grooming: Needs assistance Is this a change from baseline?: Pre-admission baseline Feeding: Independent Bathing: Needs assistance Is this a change from baseline?: Pre-admission baseline Toileting: Independent In/Out Bed: Needs assistance Is this a change from baseline?: Pre-admission baseline Walks in Home: Needs assistance Is this a change from baseline?: Pre-admission baseline Does the patient have difficulty walking or climbing stairs?: Yes Weakness of Legs: Both Weakness of Arms/Hands: Both  Permission Sought/Granted Permission sought to share information with : Family Chief Financial Officer Permission granted to share information with : Yes, Verbal Permission Granted  Share Information with NAME: Darrin Pharmacist, community  Permission granted to share info w AGENCY: [facility]  Permission granted to share info w Relationship: Nephew/ HCPOA  Permission granted to share info w Contact Information: [phone]  Emotional Assessment Appearance:: Appears stated age Attitude/Demeanor/Rapport: Unable to Assess Affect (typically observed): Unable to Assess Orientation: : Oriented to Self Alcohol / Substance Use: Not Applicable Psych Involvement:  No (comment)  Admission diagnosis:  Empyema [facility]) [J86.9] Patient Active Problem List   Diagnosis Date Noted  . Chest tube in place   . Empyema, right ([place]) 02/27/2021  .  Syncope 02/27/2021  . Anemia 02/27/2021  . GI bleed 02/27/2021  . Dementia ([place]) 02/27/2021  . Essential hypertension 02/27/2021  . Hypoxia 02/27/2021   PCP:  Patient, No Pcp Per Pharmacy:  No Pharmacies Listed    Social Determinants of Health (SDOH) Interventions    Readmission Risk Interventions No flowsheet data found.

## 2021-03-06 NOTE — Progress Notes (Signed)
PROGRESS NOTE  Karen Dyer TFT:[id number] DOB: 01/12/1941 DOA: 02/26/2021 PCP: Patient, No Pcp Per   LOS: 8 days   Brief Narrative / Interim history: Karen Dyer a 80 y.o.femalewith medical history significant fordementia, hypertension, diastolic CHF, GERD, osteoarthritis, anemia who was sent to [facility] emergency room after a syncopal event at a skilled nursing facility. Patient was scheduled to have a blood transfusion at [facility] yesterday but had a syncopal event prior to going for the transfusion. Is not clear why she was going to the transfusion.  She was noted to be hypoxic and had a CT of the chest abdomen pelvis which showed a large right-sided empyema.  She was transferred to [facility], cardiothoracic surgery consulted, status post chest tube placement by IR on 3/13.  Subjective / 24h Interval events: In chair, eating breakfast, no complaints.  No chest pain  Assessment & Plan: Principal Problem Right-sided empyema, status post chest tube placement by IR on 3/13 -Appreciate PCCM follow-up and tube management, currently getting pleural fibrinolysis.  Management and chest tube discontinuation per pulmonary -Continue IV antibiotics with Zosyn, follow cultures.  Change to Augmentin on discharge for total of 4 weeks  Active Problems Presumptive UTI -Cultures remain without growth.  Continue Zosyn  Syncope, likely secondary to orthostatic hypotension -Unable to provide history due to underlying dementia, she was orthostatic.  Resolved  Acute blood loss anemia -Received 2 units of packed red blood cells for hemoglobin of 6.5.  Hemoglobin stable, increasing on its own  Physical debility, underlying dementia -Needs SNF upon discharge, will return to her prior living situation  Scheduled Meds: . ALPRAZolam  0.5 mg Oral q morning  . lamoTRIgine  200 mg Oral q morning  . pantoprazole  40 mg Oral BID  . polyethylene glycol  17 g Oral QODAY  .  potassium chloride  40 mEq Oral Once  . QUEtiapine  37.5 mg Oral QHS   Continuous Infusions: . piperacillin-tazobactam (ZOSYN)  IV 3.375 g (03/06/21 0853)   PRN Meds:.acetaminophen, traMADol  Diet Orders (From admission, onward)    Start     Ordered   02/28/21 1421  Diet regular Room service appropriate? No; Fluid consistency: Thin  Diet effective now       Question Answer Comment  Room service appropriate? No   Fluid consistency: Thin      02/28/21 1420          DVT prophylaxis: SCDs Start: 02/27/21 0125     Code Status: Full Code  Family Communication: Updated nephew over the phone 3/17  Status is: Inpatient  Remains inpatient appropriate because:Inpatient level of care appropriate due to severity of illness   Dispo:  Patient From: [place]  Planned Disposition: [place]  Medically stable for discharge: No     Level of care: Telemetry Cardiac  Consultants:  Cardiothoracic surgery IR  Procedures:  Chest tube placement 3/13  Microbiology  Cultures - pending, NGTD  Antimicrobials: Vancomycin / Zosyn 3/12 >>    Objective: Vitals:   03/04/21 1350 03/04/21 2041 03/05/21 2114 03/06/21 0527  BP: (!) 176/56 (!) 131/51 135/61 (!) 145/64  Pulse: 93 99 87 78  Resp: 16 18 16 18   Temp: 98.2 F (36.8 C) 98.1 F (36.7 C) 98.5 F (36.9 C) 98.6 F (37 C)  TempSrc: Oral  Oral Oral  SpO2: 98%  97% 98%  Weight:      Height:        Intake/Output Summary (Last 24 hours)  at 03/06/2021 1009 Last data filed at 03/05/2021 1700 Gross per 24 hour  Intake --  Output 150 ml  Net -150 ml   Filed Weights   02/26/21 2202  Weight: 69.7 kg    Examination:  Constitutional: No distress Eyes: No icterus ENMT: mmm Neck: normal, supple Respiratory: Clear bilaterally, no wheezing, no crackles Cardiovascular: Regular rate and rhythm, no murmurs, no edema Abdomen: Soft, nontender, nondistended, bowel sounds positive Skin: No  rashes Neurologic: No focal deficits  Data Reviewed: I have independently reviewed following labs and imaging studies   CBC: Recent Labs  Lab 02/28/21 0648 03/01/21 0632 03/02/21 0200 03/03/21 0407 03/04/21 0414 03/06/21 0133  WBC 13.1* 10.1 9.2 9.0 7.6 6.8  NEUTROABS 8.9*  --   --   --   --   --   HGB 9.9* 9.6* 9.1* 9.6* 9.6* 10.5*  HCT 30.3* 29.6* 29.9* 30.1* 31.3* 34.4*  MCV 75.8* 76.7* 79.7* 78.0* 78.6* 79.3*  PLT 625* 528* 498* 545* 581* 341*   Basic Metabolic Panel: Recent Labs  Lab 02/27/21 1637 02/27/21 1637 02/28/21 0648 03/01/21 0632 03/02/21 0200 03/03/21 0407 03/04/21 0414 03/06/21 0133  NA  --    < > 138 138 137 139 138 135  K 3.8  --  4.7 3.6 3.6 3.3* 3.2* 3.6  CL  --    < > 105 106 104 106 104 101  CO2  --    < > 26 25 25 24 26 26   GLUCOSE  --    < > 94 95 72 107* 101* 90  BUN  --    < > 9 12 14 15 9 8   CREATININE  --    < > 0.68 0.70 0.69 0.65 0.58 0.58  CALCIUM  --    < > 8.9 8.5* 8.4* 8.5* 8.5* 8.6*  MG 2.0  --  2.0  --   --   --   --   --   PHOS 3.0  --  2.8  --   --   --   --   --    < > = values in this interval not displayed.   Liver Function Tests: No results for input(s): AST, ALT, ALKPHOS, BILITOT, PROT, ALBUMIN in the last 168 hours. Coagulation Profile: No results for input(s): INR, PROTIME in the last 168 hours. HbA1C: No results for input(s): HGBA1C in the last 72 hours. CBG: Recent Labs  Lab 03/04/21 1132 03/04/21 1620 03/05/21 0722 03/05/21 1134 03/05/21 2056  GLUCAP 100* 89 91 117* 78    Recent Results (from the past 240 hour(s))  MRSA PCR Screening     Status: Abnormal   Collection Time: 02/27/21  1:25 AM   Specimen: Nasal Mucosa; Nasopharyngeal  Result Value Ref Range Status   MRSA by PCR POSITIVE (A) NEGATIVE Final    Comment: CRITICAL RESULT CALLED TO, READ BACK BY AND VERIFIED WITH: RNTamala Ser [id number] @0448  THANEY Performed at [place] [address] [place], [place] [address]   Culture, Urine      Status: None   Collection Time: 02/27/21  5:55 AM   Specimen: Urine, Random  Result Value Ref Range Status   Specimen Description URINE, RANDOM  Final   Special Requests NONE  Final   Culture   Final    NO GROWTH Performed at [facility], [place] [address] [place], [place] [address]    Report Status 03/03/2021 FINAL  Final  Culture, blood (routine x  2)     Status: None   Collection Time: 02/27/21  4:37 PM   Specimen: BLOOD RIGHT HAND  Result Value Ref Range Status   Specimen Description BLOOD RIGHT HAND  Final   Special Requests   Final    BOTTLES DRAWN AEROBIC AND ANAEROBIC Blood Culture adequate volume   Culture   Final    NO GROWTH 5 DAYS Performed at [facility], [address], [place], [place] [address]    Report Status 03/04/2021 FINAL  Final  Culture, blood (routine x 2)     Status: None   Collection Time: 02/27/21  4:48 PM   Specimen: BLOOD LEFT HAND  Result Value Ref Range Status   Specimen Description BLOOD LEFT HAND  Final   Special Requests   Final    BOTTLES DRAWN AEROBIC AND ANAEROBIC Blood Culture adequate volume   Culture   Final    NO GROWTH 5 DAYS Performed at [facility], [place] [address] [place], [place] [address]    Report Status 03/04/2021 FINAL  Final  Aerobic/Anaerobic Culture (surgical/deep wound)     Status: None   Collection Time: 02/28/21  1:04 PM   Specimen: Abscess  Result Value Ref Range Status   Specimen Description ABSCESS  Final   Special Requests EMPYEMA  Final   Gram Stain   Final    ABUNDANT WBC PRESENT,BOTH PMN AND MONONUCLEAR NO ORGANISMS SEEN    Culture   Final    No growth aerobically or anaerobically. Performed at [facility], [place] [address] [place], [place] [address]    Report Status 03/05/2021 FINAL  Final     Radiology Studies: No results found.   Marzetta Board, MD, PhD [facility]  Between 7 am - 7 pm I am available, please contact me via Amion or Securechat  Between 7 pm -  7 am I am not available, please contact night coverage MD/APP via Amion

## 2021-03-07 ENCOUNTER — Inpatient Hospital Stay (HOSPITAL_COMMUNITY): Payer: Medicare Other

## 2021-03-07 DIAGNOSIS — F0391 Unspecified dementia with behavioral disturbance: Secondary | ICD-10-CM | POA: Diagnosis not present

## 2021-03-07 DIAGNOSIS — Z9689 Presence of other specified functional implants: Secondary | ICD-10-CM | POA: Diagnosis not present

## 2021-03-07 DIAGNOSIS — J869 Pyothorax without fistula: Secondary | ICD-10-CM | POA: Diagnosis not present

## 2021-03-07 LAB — GLUCOSE, CAPILLARY: Glucose-Capillary: 109 mg/dL — ABNORMAL HIGH (ref 70–99)

## 2021-03-07 NOTE — Progress Notes (Signed)
   NAME:  Karen Dyer, MRN:  [id number], DOB:  [birth year], LOS: 15 ADMISSION DATE:  02/26/2021, CONSULTATION DATE:  03/03/21 REFERRING MD:  Orvan Seen, CHIEF COMPLAINT:  none   Brief History   80 year old SNF resident with advanced dementia presenting with empyema.  History of present illness   80 year old SNF resident with advanced dementia presenting with empyema.  Medical history as below.  All questions I ask she states "I'm fine."  Uncooperative with exam and yells at me when attempted.  History per chart review.  Had ?syncopal event and hypoxemia at [facility], transferred here after CT showed emypema.  Pigtail placed 3 days ago.  Not a decortication candidate. PCCM consulted to help manage empyema medically.  Past Medical History  HFPEF GERD OA Anemia  Significant Hospital Events   3/13 pigtail inserted by IR 3/16 lytics  3/17 minimal CT output; lytics 3/18 300 ml out; lytics repeated 3/19: 113ml output of chest tube. cxr stable if not slightly improved. On RA. Sitting up oob eating breakfast. Extremely HOH  Consults:  TCTS Pulmonary   Procedures:  3/13 pigtail inserted by IR >> 3/20  Significant Diagnostic Tests:  CXR: R empyema  Micro Data:  3/12 MRSA >> positive 3/12 UC >> neg 3/12 BCx >> neg 3/13 right pleural fluid >> neg  Antimicrobials:  3/11 zosyn >> 3/12 Vanc  >> 3/15  Interim history/subjective:  No complaints from patient other than asking to be cleaned up.  States she slept well.  Denies any SOB or CP, mild soreness at pigtail site.  Afebrile   Objective   Blood pressure (!) 145/80, pulse 82, temperature 97.9 F (36.6 C), temperature source Oral, resp. rate 18, height 5\' 6"  (1.676 m), weight 69.7 kg, SpO2 98 %.        Intake/Output Summary (Last 24 hours) at 03/07/2021 0841 Last data filed at 03/06/2021 1700 Gross per 24 hour  Intake 360 ml  Output 125 ml  Net 235 ml   Filed Weights   02/26/21 2202  Weight: 69.7 kg    Examination: General:   Pleasant thin elderly female lying in bed in NAD, very HOH HEENT: MM pink/moist, lips chapped Neuro: Awake, oriented to self/ year, otherwise pleasantly confused, MAE CV: rr, no murmur PULM:  Non- labored, lung sounds clear- slightly diminished on right, remains on room air GI: soft, bs+, NT, incontinent of urine Extremities: warm/dry, no LE edema  Skin: no rashes  Resolved Hospital Problem list   n/a  Assessment & Plan:  -Acute respiratory failure and syncope of unclear origin resolved -R empyema with negative cultures, question if this a chronic process; not surgical decortication candidate -Advanced dementia with behavioral disturbance -Muscular deconditioning  - minimal output from right pigtail CT; clamped 3/19 - CXR this morning, right effusion stable, will d/c today  - recommend augmentin x 4 weeks and repeat imaging in [place]. - Mobilize and out of bed as able with assistance.          Kennieth Rad, ACNP [facility] 03/07/2021, 8:41 AM

## 2021-03-07 NOTE — Progress Notes (Signed)
PROGRESS NOTE  Ruther Ephraim FTD:[id number] DOB: 13-Mar-1941 DOA: 02/26/2021 PCP: Patient, No Pcp Per   LOS: 9 days   Brief Narrative / Interim history: [name] a 80 y.o.femalewith medical history significant fordementia, hypertension, diastolic CHF, GERD, osteoarthritis, anemia who was sent to [facility] emergency room after a syncopal event at a skilled nursing facility. Patient was scheduled to have a blood transfusion at [facility] yesterday but had a syncopal event prior to going for the transfusion. Is not clear why she was going to the transfusion.  She was noted to be hypoxic and had a CT of the chest abdomen pelvis which showed a large right-sided empyema.  She was transferred to [facility], cardiothoracic surgery consulted, status post chest tube placement by IR on 3/13.  Subjective / 24h Interval events: In bed, appears comfortable.  Has no complaints  Assessment & Plan: Principal Problem Right-sided empyema, status post chest tube placement by IR on 3/13 -Appreciate PCCM follow-up and tube management, currently getting pleural fibrinolysis.  Chest tube managed by pulmonary.  To be discontinued today. -Continue IV antibiotics with Zosyn, follow cultures.  Change to Augmentin on discharge for total of 4 weeks  Active Problems Presumptive UTI -Cultures remain without growth.  Continue Zosyn, transition to Augmentin on discharge  Syncope, likely secondary to orthostatic hypotension -Unable to provide history due to underlying dementia, she was orthostatic.  Resolved  Acute blood loss anemia -Received 2 units of packed red blood cells for hemoglobin of 6.5.  Hemoglobin remained stable  Physical debility, underlying dementia -Needs SNF upon discharge, will return to her prior living situation.  TOC contacted today  Scheduled Meds: . ALPRAZolam  0.5 mg Oral q morning  . lamoTRIgine  200 mg Oral q morning  . pantoprazole  40 mg Oral BID  .  polyethylene glycol  17 g Oral QODAY  . potassium chloride  40 mEq Oral Once  . QUEtiapine  37.5 mg Oral QHS   Continuous Infusions: . piperacillin-tazobactam (ZOSYN)  IV 3.375 g (03/07/21 0856)   PRN Meds:.acetaminophen, traMADol  Diet Orders (From admission, onward)    Start     Ordered   02/28/21 1421  Diet regular Room service appropriate? No; Fluid consistency: Thin  Diet effective now       Question Answer Comment  Room service appropriate? No   Fluid consistency: Thin      02/28/21 1420          DVT prophylaxis: SCDs Start: 02/27/21 0125     Code Status: Full Code  Family Communication: Updated nephew over the phone  Status is: Inpatient  Remains inpatient appropriate because:Inpatient level of care appropriate due to severity of illness   Dispo:  Patient From: [place]  Planned Disposition: [place]  Medically stable for discharge: No     Level of care: Telemetry Cardiac  Consultants:  Cardiothoracic surgery IR  Procedures:  Chest tube placement 3/13  Microbiology  Cultures - pending, NGTD  Antimicrobials: Vancomycin / Zosyn 3/12 >>    Objective: Vitals:   03/05/21 2114 03/06/21 0527 03/06/21 1957 03/07/21 0412  BP: 135/61 (!) 145/64 (!) 152/89 (!) 145/80  Pulse: 87 78 95 82  Resp: 16 18 20 18   Temp: 98.5 F (36.9 C) 98.6 F (37 C) 98 F (36.7 C) 97.9 F (36.6 C)  TempSrc: Oral Oral  Oral  SpO2: 97% 98% 99% 98%  Weight:      Height:  Intake/Output Summary (Last 24 hours) at 03/07/2021 0904 Last data filed at 03/06/2021 1700 Gross per 24 hour  Intake 120 ml  Output 125 ml  Net -5 ml   Filed Weights   02/26/21 2202  Weight: 69.7 kg    Examination:  Constitutional: NAD Eyes: No icterus ENMT: mmm Neck: normal, supple Respiratory: Clear bilaterally without wheezing or crackles Cardiovascular: Regular rate and rhythm, no murmurs, no edema Abdomen: Soft, NT, ND, bowel sounds  positive Skin: No rashes seen Neurologic: Nonfocal  Data Reviewed: I have independently reviewed following labs and imaging studies   CBC: Recent Labs  Lab 03/01/21 0632 03/02/21 0200 03/03/21 0407 03/04/21 0414 03/06/21 0133  WBC 10.1 9.2 9.0 7.6 6.8  HGB 9.6* 9.1* 9.6* 9.6* 10.5*  HCT 29.6* 29.9* 30.1* 31.3* 34.4*  MCV 76.7* 79.7* 78.0* 78.6* 79.3*  PLT 528* 498* 545* 581* 938*   Basic Metabolic Panel: Recent Labs  Lab 03/01/21 0632 03/02/21 0200 03/03/21 0407 03/04/21 0414 03/06/21 0133  NA 138 137 139 138 135  K 3.6 3.6 3.3* 3.2* 3.6  CL 106 104 106 104 101  CO2 25 25 24 26 26   GLUCOSE 95 72 107* 101* 90  BUN 12 14 15 9 8   CREATININE 0.70 0.69 0.65 0.58 0.58  CALCIUM 8.5* 8.4* 8.5* 8.5* 8.6*   Liver Function Tests: No results for input(s): AST, ALT, ALKPHOS, BILITOT, PROT, ALBUMIN in the last 168 hours. Coagulation Profile: No results for input(s): INR, PROTIME in the last 168 hours. HbA1C: No results for input(s): HGBA1C in the last 72 hours. CBG: Recent Labs  Lab 03/04/21 1620 03/05/21 0722 03/05/21 1134 03/05/21 2056 03/06/21 1955  GLUCAP 89 91 117* 78 93    Recent Results (from the past 240 hour(s))  MRSA PCR Screening     Status: Abnormal   Collection Time: 02/27/21  1:25 AM   Specimen: Nasal Mucosa; Nasopharyngeal  Result Value Ref Range Status   MRSA by PCR POSITIVE (A) NEGATIVE Final    Comment: CRITICAL RESULT CALLED TO, READ BACK BY AND VERIFIED WITH: RNTamala Ser [id number] @0448  THANEY Performed at [facility], [place] [address], [place], [place] [address]   Culture, Urine     Status: None   Collection Time: 02/27/21  5:55 AM   Specimen: Urine, Random  Result Value Ref Range Status   Specimen Description URINE, RANDOM  Final   Special Requests NONE  Final   Culture   Final    NO GROWTH Performed at [facility], [place] [address] [place], [place] [address]    Report Status 03/03/2021 FINAL  Final  Culture, blood (routine  x 2)     Status: None   Collection Time: 02/27/21  4:37 PM   Specimen: BLOOD RIGHT HAND  Result Value Ref Range Status   Specimen Description BLOOD RIGHT HAND  Final   Special Requests   Final    BOTTLES DRAWN AEROBIC AND ANAEROBIC Blood Culture adequate volume   Culture   Final    NO GROWTH 5 DAYS Performed at [facility], [place] [address] [place], [place] [address]    Report Status 03/04/2021 FINAL  Final  Culture, blood (routine x 2)     Status: None   Collection Time: 02/27/21  4:48 PM   Specimen: BLOOD LEFT HAND  Result Value Ref Range Status   Specimen Description BLOOD LEFT HAND  Final   Special Requests   Final    BOTTLES DRAWN  AEROBIC AND ANAEROBIC Blood Culture adequate volume   Culture   Final    NO GROWTH 5 DAYS Performed at [facility], [place] [address], [place], [place] [address]    Report Status 03/04/2021 FINAL  Final  Aerobic/Anaerobic Culture (surgical/deep wound)     Status: None   Collection Time: 02/28/21  1:04 PM   Specimen: Abscess  Result Value Ref Range Status   Specimen Description ABSCESS  Final   Special Requests EMPYEMA  Final   Gram Stain   Final    ABUNDANT WBC PRESENT,BOTH PMN AND MONONUCLEAR NO ORGANISMS SEEN    Culture   Final    No growth aerobically or anaerobically. Performed at [facility], [place] [address], [place], [place] [address]    Report Status 03/05/2021 FINAL  Final     Radiology Studies: DG CHEST PORT 1 VIEW  Result Date: 03/07/2021 CLINICAL DATA:  Pleural effusion. EXAM: PORTABLE CHEST 1 VIEW COMPARISON:  Prior chest radiographs 03/06/2021 and earlier. CT chest 02/26/2021. FINDINGS: Unchanged position of a right-sided pleural pigtail catheter. Mild cardiomegaly, unchanged. Unchanged small right pleural effusion with associated right basilar atelectasis and/or consolidation. No consolidation within the left lung. Possible trace left pleural effusion. No evidence of pneumothorax. Hiatal hernia. IMPRESSION: No  significant change as compared to the chest radiograph of 03/06/2021. Unchanged position of a right pleural pigtail catheter. Persistent small right pleural effusion with associated right basilar atelectasis and/or consolidation. Unchanged cardiomegaly. Hiatal hernia. Electronically Signed   By: Kellie Simmering DO   On: 03/07/2021 08:30   DG Chest Port 1 View  Result Date: 03/06/2021 CLINICAL DATA:  Shortness of breath, evaluate right pigtail pleural catheter. EXAM: PORTABLE CHEST 1 VIEW COMPARISON:  Chest radiograph dated 03/05/2021. FINDINGS: A right-sided pleural pigtail catheter overlies the right lower lung, similar to prior exam accounting for differences in patient positioning. A right-sided pleural effusion appears increased and is moderate in size. There is associated atelectasis/airspace disease which appears unchanged. The left lung is clear without pleural effusion. There is no pneumothorax. The heart is borderline enlarged. IMPRESSION: Interval increase in size of a moderate right pleural effusion. Unchanged right pleural pigtail catheter. Electronically Signed   By: Zerita Boers M.D.   On: 03/06/2021 15:20     Marzetta Board, MD, PhD [facility]  Between 7 am - 7 pm I am available, please contact me via Amion or Securechat  Between 7 pm - 7 am I am not available, please contact night coverage MD/APP via Amion

## 2021-03-08 DIAGNOSIS — J869 Pyothorax without fistula: Secondary | ICD-10-CM | POA: Diagnosis not present

## 2021-03-08 DIAGNOSIS — I10 Essential (primary) hypertension: Secondary | ICD-10-CM

## 2021-03-08 DIAGNOSIS — F0391 Unspecified dementia with behavioral disturbance: Secondary | ICD-10-CM | POA: Diagnosis not present

## 2021-03-08 LAB — RESP PANEL BY RT-PCR (FLU A&B, COVID) ARPGX2
Influenza A by PCR: NEGATIVE
Influenza B by PCR: NEGATIVE
SARS Coronavirus 2 by RT PCR: NEGATIVE

## 2021-03-08 MED ORDER — AMOXICILLIN-POT CLAVULANATE 875-125 MG PO TABS
1.0000 | ORAL_TABLET | Freq: Two times a day (BID) | ORAL | 0 refills | Status: AC
Start: 2021-03-08 — End: 2021-04-05

## 2021-03-08 MED ORDER — ALPRAZOLAM 0.5 MG PO TABS
0.5000 mg | ORAL_TABLET | Freq: Every morning | ORAL | 0 refills | Status: AC
Start: 2021-03-08 — End: ?

## 2021-03-08 MED ORDER — OXYCODONE HCL 5 MG PO TABS: 5.0000 mg | ORAL_TABLET | ORAL | 0 refills | Status: AC | PRN

## 2021-03-08 NOTE — Progress Notes (Signed)
Physical Therapy Treatment Patient Details Name: Karen Dyer MRN: [id number] DOB: 13-Jan-1941 Today's Date: 03/08/2021    History of Present Illness 80 yo admitted 3/11 from SNF with syncopal episode. Pt with Rt empyema and anemia. PMhx: dementia, HTN, dCHF, GERD, osteoarthritis, anemia    PT Comments    Today's skilled session continued to focus on strengthening and mobility progression. Pt asleep in chair. Awoke and agreed to some PT. Performed ex's with assistance needed at times. When attempting to set up for standing/gait pt became agitated stating repeatedly "I just want to sleep". Offer to get pt back to bed to sleep, pt stated "I am fine here". Pt positioned in chair with pillow/blanket with chair alarm and back to sleep. RN made aware.     Follow Up Recommendations  SNF     Equipment Recommendations  None recommended by PT    Precautions / Restrictions Precautions Precautions: Fall Restrictions Weight Bearing Restrictions: No    Mobility  Bed Mobility               General bed mobility comments: OOB in chair before and after session        Cognition Arousal/Alertness: Awake/alert;Lethargic (will wake up when engaged, otherwise closes eyes stating "I want to sleep".) Behavior During Therapy: WFL for tasks assessed/performed;Agitated (started to get agitated when attempting to progress mobility)                 General Comments: pleasantly confused. Difficult to understand at times, but following commands well.      Exercises General Exercises - Lower Extremity Ankle Circles/Pumps: AROM;Strengthening;Both;10 reps;Seated;Limitations Ankle Circles/Pumps Limitations: with foot rest up Long Arc Quad: AROM;Strengthening;Both;10 reps;Seated;Limitations Long [employer] Limitations: with foot rest down Hip ABduction/ADduction: AROM;AAROM;Strengthening;Both;10 reps;Seated;Limitations Hip Abduction/Adduction Limitations: with foot rest up Straight Leg Raises:  AROM;AAROM;Strengthening;Both;10 reps;Seated;Limitations Straight Leg Raises Limitations: with foot rest up Toe Raises: AROM;Strengthening;Both;10 reps;Seated;Limitations Toe Raises Limitations: with foot rest down Heel Raises: AROM;Strengthening;Both;10 reps;Seated;Limitations Heel Raises Limitations: with foot rest down     Pertinent Vitals/Pain Pain Assessment: No/denies pain Pain Score: 0-No pain     PT Goals (current goals can now be found in the care plan section) Acute Rehab PT Goals Patient Stated Goal: return home and sleep PT Goal Formulation: With patient Time For Goal Achievement: 03/14/21 Potential to Achieve Goals: Fair Progress towards PT goals: Progressing toward goals    Frequency    Min 2X/week      PT Plan Current plan remains appropriate    AM-PAC PT "6 Clicks" Mobility   Outcome Measure  Help needed turning from your back to your side while in a flat bed without using bedrails?: A Little Help needed moving from lying on your back to sitting on the side of a flat bed without using bedrails?: A Little Help needed moving to and from a bed to a chair (including a wheelchair)?: A Little Help needed standing up from a chair using your arms (e.g., wheelchair or bedside chair)?: A Little Help needed to walk in hospital room?: A Little Help needed climbing 3-5 steps with a railing? : A Lot 6 Click Score: 17    End of Session   Activity Tolerance: Patient limited by fatigue;Patient limited by lethargy Patient left: in chair;with call bell/phone within reach;with chair alarm set Nurse Communication: Mobility status PT Visit Diagnosis: Other abnormalities of gait and mobility (R26.89);Difficulty in walking, not elsewhere classified (R26.2)     Time: 1250-1300 PT Time Calculation (min) (ACUTE ONLY): 10  min  Charges:  $Therapeutic Exercise: 8-22 mins                    Willow Ora, [place], [facility] [employer] [phone] 03/08/21, 1:10  PM   Willow Ora 03/08/2021, 1:10 PM

## 2021-03-08 NOTE — Plan of Care (Signed)
°  Problem: Clinical Measurements: °Goal: Respiratory complications will improve °Outcome: Progressing °  °Problem: Clinical Measurements: °Goal: Cardiovascular complication will be avoided °Outcome: Progressing °  °Problem: Pain Managment: °Goal: General experience of comfort will improve °Outcome: Progressing °  °Problem: Safety: °Goal: Ability to remain free from injury will improve °Outcome: Progressing °  °

## 2021-03-08 NOTE — Discharge Instructions (Signed)
Follow with Patient, No Pcp Per in 5-7 days  Please get a complete blood count and chemistry panel checked by your Primary MD at your next visit, and again as instructed by your Primary MD. Please get your medications reviewed and adjusted by your Primary MD.  Please request your Primary MD to go over all Hospital Tests and Procedure/Radiological results at the follow up, please get all Hospital records sent to your Prim MD by signing hospital release before you go home.  In some cases, there will be blood work, cultures and biopsy results pending at the time of your discharge. Please request that your primary care M.D. goes through all the records of your hospital data and follows up on these results.  If you had Pneumonia of Lung problems at the Hospital: Please get a 2 view Chest X ray done in 6-8 weeks after hospital discharge or sooner if instructed by your Primary MD.  If you have Congestive Heart Failure: Please call your Cardiologist or Primary MD anytime you have any of the following symptoms:  1) 3 pound weight gain in 24 hours or 5 pounds in 1 week  2) shortness of breath, with or without a dry hacking cough  3) swelling in the hands, feet or stomach  4) if you have to sleep on extra pillows at night in order to breathe  Follow cardiac low salt diet and 1.5 lit/day fluid restriction.  If you have diabetes Accuchecks 4 times/day, Once in AM empty stomach and then before each meal. Log in all results and show them to your primary doctor at your next visit. If any glucose reading is under 80 or above 300 call your primary MD immediately.  If you have Seizure/Convulsions/Epilepsy: Please do not drive, operate heavy machinery, participate in activities at heights or participate in high speed sports until you have seen by Primary MD or a Neurologist and advised to do so again. Per [facility] statutes, patients with seizures are not allowed to drive until they have been  seizure-free for six months.  Use caution when using heavy equipment or power tools. Avoid working on ladders or at heights. Take showers instead of baths. Ensure the water temperature is not too high on the home water heater. Do not go swimming alone. Do not lock yourself in a room alone (i.e. bathroom). When caring for infants or small children, sit down when holding, feeding, or changing them to minimize risk of injury to the child in the event you have a seizure. Maintain good sleep hygiene. Avoid alcohol.   If you had Gastrointestinal Bleeding: Please ask your Primary MD to check a complete blood count within one week of discharge or at your next visit. Your endoscopic/colonoscopic biopsies that are pending at the time of discharge, will also need to followed by your Primary MD.  Get Medicines reviewed and adjusted. Please take all your medications with you for your next visit with your Primary MD  Please request your Primary MD to go over all hospital tests and procedure/radiological results at the follow up, please ask your Primary MD to get all Hospital records sent to his/her office.  If you experience worsening of your admission symptoms, develop shortness of breath, life threatening emergency, suicidal or homicidal thoughts you must seek medical attention immediately by calling 911 or calling your MD immediately  if symptoms less severe.  You must read complete instructions/literature along with all the possible adverse reactions/side effects for all the Medicines you  take and that have been prescribed to you. Take any new Medicines after you have completely understood and accpet all the possible adverse reactions/side effects.   Do not drive or operate heavy machinery when taking Pain medications.   Do not take more than prescribed Pain, Sleep and Anxiety Medications  Special Instructions: If you have smoked or chewed Tobacco  in the last 2 yrs please stop smoking, stop any regular  Alcohol  and or any Recreational drug use.  Wear Seat belts while driving.  Please note You were cared for by a hospitalist during your hospital stay. If you have any questions about your discharge medications or the care you received while you were in the hospital after you are discharged, you can call the unit and asked to speak with the hospitalist on call if the hospitalist that took care of you is not available. Once you are discharged, your primary care physician will handle any further medical issues. Please note that NO REFILLS for any discharge medications will be authorized once you are discharged, as it is imperative that you return to your primary care physician (or establish a relationship with a primary care physician if you do not have one) for your aftercare needs so that they can reassess your need for medications and monitor your lab values.  You can reach the hospitalist office at phone [phone] or fax [phone]   If you do not have a primary care physician, you can call [phone] for a physician referral.  Activity: As tolerated with Full fall precautions use walker/cane & assistance as needed    Diet: regular  Disposition SNF

## 2021-03-08 NOTE — TOC Progression Note (Addendum)
Transition of Care [facility]) - Progression Note    Patient Details  Name: Pearson Reasons MRN: [id number] Date of Birth: 12-Mar-1941  Transition [facility] Care [facility]) CM/SW [place], RN Phone Number: [phone]  03/08/2021, 1:59 PM  Clinical Narrative:    D/c summary has been submitted via the Hub. Covid results are in. CM has attempted to contact [facility] with admission. Message has been left. Will await return call.   1500 CM has called [facility] and left message for Olivia Mackie Civil engineer, contracting of admissions) CM waiting on final word to determine if we are good to proceed with discharge back to [facility] today. Will await return call.   46 Cm has attempted to call directly to facility to determine if patient can return and received room assignment. CM has received voicemail for [employer] of admissions. Will have to await return call.  Expected Discharge Plan: [place] Barriers to Discharge: Continued Medical Work up  Expected Discharge Plan and Services Expected Discharge Plan: [place] Choice: [place] arrangements for the past 2 months: [place] Expected Discharge Date: 03/08/21                                     Social Determinants of Health (SDOH) Interventions    Readmission Risk Interventions No flowsheet data found.

## 2021-03-08 NOTE — Discharge Summary (Addendum)
Physician Discharge Summary  Karen Dyer POE:[id number] DOB: 04/10/1941 DOA: 02/26/2021  PCP: Patient, No Pcp Per  Admit date: 02/26/2021 Discharge date: 03/09/2021  Admitted From: SNF Disposition:  SNF  Patient seen and examined this morning, 3/22, stable for discharge. FOr some reason she was not discharged 3/21.  Recommendations for Outpatient Follow-up:  1. Follow up with PCP in 1-2 weeks 2. Follow-up with pulmonology in 3 weeks 3. Repeat the chest x-ray in 2 to 3 weeks  Home Health: None Equipment/Devices: None  Discharge Condition: Stable CODE STATUS: Full code Diet recommendation: Regular  HPI: Per admitting MD, Karen Dyer is a 80 y.o. female with medical history significant for dementia, hypertension, diastolic CHF, GERD, osteoarthritis, anemia who was sent to [facility] emergency room after a syncopal event at a skilled nursing facility.  Patient was scheduled to have a blood transfusion at [facility] yesterday but had a syncopal event prior to going for the transfusion.  Is not clear why she was going to the transfusion.  Patient is unable to give any history.  There is no family present.  Right after the syncopal event patient was noted to be hypoxic with O2 sat in the 30s.  She was obtunded per EMS report initially but responded to an ammonia inhalant.  She had a negative CT of her head at [facility].  She had a significant elevated D-dimer of over 6000.  CT angiography of her chest abdomen pelvis was obtained.  She does not have any pulmonary embolism but she does have a large right-sided empyema.  She has a mildly elevated white blood cell count.  She was found to be Hemoccult positive.  Her hemoglobin was 7.4 at the emergency room.  Hospital Course / Discharge diagnoses: Principal Problem Right-sided empyema, status post chest tube placement by IR on 3/13 -patient was admitted to the hospital with right-sided empyema.  Cardiothoracic surgery and  pulmonology were consulted.  She underwent chest tube placement by IR on admission.  She got pleural fibrinolysis for several days per pulmonology.  Eventually her chest tube stopped draining, it was clamped and removed on 3/20.  Chest x-ray has remained stable, she is clinically stable, she was maintained on Zosyn while hospitalized and will transition to Augmentin for 4 weeks.  She will need follow-up with PCP, repeat chest x-ray in the future as well as follow-up with pulmonology as an outpatient.  All cultures have remained negative  Active Problems Presumptive UTI -Cultures remain without growth.  Has received antibiotics for #1 Syncope, likely secondary to orthostatic hypotension -Unable to provide history due to underlying dementia, she was orthostatic.  Resolved Anemia, unclear cause-Received 2 units of packed red blood cells for hemoglobin of 6.5.  Hemoglobin remained stable and increasing on its own.  Clinically without bleeding.  This can be further worked up as an outpatient. Physical debility, underlying dementia -to SNF  Sepsis ruled out   Discharge Instructions   Allergies as of 03/09/2021      Reactions   Aspirin Other (See Comments)   Unknown reaction - reported by [facility] 2017   Other Other (See Comments)   Unknown reaction to Nuts, oranges - reported by [facility] 2017      Medication List    STOP taking these medications   levofloxacin 750 MG tablet Commonly known as: LEVAQUIN     TAKE these medications   acetaminophen 500 MG tablet Commonly known as: TYLENOL Take 1,000 mg by mouth every 12 (twelve)  hours.   ALPRAZolam 0.5 MG tablet Commonly known as: XANAX Take 1 tablet (0.5 mg total) by mouth every morning.   amoxicillin-clavulanate 875-125 MG tablet Commonly known as: AUGMENTIN Take 1 tablet by mouth 2 (two) times daily for 28 days.   Boost Very High Calorie Liqd Take 1 Can by mouth 2 (two) times daily.   lamoTRIgine 200 MG  tablet Commonly known as: LAMICTAL Take 200 mg by mouth every morning.   melatonin 5 MG Tabs Take 5 mg by mouth at bedtime.   ondansetron 4 MG tablet Commonly known as: ZOFRAN Take 4 mg by mouth every 6 (six) hours as needed for nausea or vomiting.   oxyCODONE 5 MG immediate release tablet Commonly known as: Oxy IR/ROXICODONE Take 1 tablet (5 mg total) by mouth every 4 (four) hours as needed (pain).   pantoprazole 40 MG tablet Commonly known as: PROTONIX Take 40 mg by mouth 2 (two) times daily.   polyethylene glycol 17 g packet Commonly known as: MIRALAX / GLYCOLAX Take 17 g by mouth every other day.   QUEtiapine 25 MG tablet Commonly known as: SEROQUEL Take 37.5 mg by mouth at bedtime.   Vitamin D3 1.25 MG (50000 UT) Caps Take 50,000 Units by mouth every Monday.       Follow-up Information    Candee Furbish, MD. Schedule an appointment as soon as possible for a visit in 3 week(s).   Specialty: Pulmonary Disease Contact information: [address] [place] [address] [phone]               Consultations:  Pulmonology   Cardiothoracic surgery   IR  Procedures/Studies:  Chest tube  DG Chest 1 View  Result Date: 03/05/2021 CLINICAL DATA:  Empyema, chest tube EXAM: CHEST  1 VIEW COMPARISON:  Portable exam 0613 hours compared to 03/04/2021 FINDINGS: Pigtail RIGHT thoracostomy tube unchanged. Upper normal heart size. Pulmonary vascularity normal. LEFT paraspinal density at inferior chest likely a small hiatal hernia, seen on a prior CT. Volume loss in RIGHT hemithorax with atelectasis and pleural effusion at inferior RIGHT hemithorax unchanged. Remaining lungs clear. Skin folds project over LEFT lung. No pneumothorax or acute osseous findings. IMPRESSION: Persistent atelectasis and pleural effusion at RIGHT lung base. Electronically Signed   By: Lavonia Dana M.D.   On: 03/05/2021 08:30   DG Chest 1 View  Result Date: 03/04/2021 CLINICAL DATA:  Chest  tube in place EXAM: CHEST  1 VIEW COMPARISON:  March 02, 2021 FINDINGS: Pigtail catheter again noted on the right with apparent kink in the tube, stable. No pneumothorax evident. Partially loculated pleural effusion on the right with ill-defined airspace opacity in the right base persists. There is a small area of airspace opacity in the medial left base. Left lung otherwise clear. Heart is upper normal in size with pulmonary vascularity normal. No adenopathy. No bone lesions. IMPRESSION: Pigtail catheter again noted on the right with apparent kink in this catheter. There is a loculated pleural effusion on the right with atelectasis and potential superimposed pneumonia right base. Ill-defined opacity medial left base concerning for focus of pneumonia. Left lung otherwise clear. Stable cardiac prominence. Aortic Atherosclerosis (ICD10-I70.0). Electronically Signed   By: Lowella Grip III M.D.   On: 03/04/2021 08:25   DG Wrist 2 Views Right  Result Date: 03/03/2021 CLINICAL DATA:  Wrist pain after fall EXAM: RIGHT WRIST - 2 VIEW COMPARISON:  09/28/2020 FINDINGS: No acute displaced fracture or malalignment. Chronic fracture deformity of the distal  radius. Soft tissues are unremarkable. IMPRESSION: No acute osseous abnormality. Electronically Signed   By: Donavan Foil M.D.   On: 03/03/2021 21:25   DG CHEST PORT 1 VIEW  Result Date: 03/07/2021 CLINICAL DATA:  Pleural effusion. EXAM: PORTABLE CHEST 1 VIEW COMPARISON:  Prior chest radiographs 03/06/2021 and earlier. CT chest 02/26/2021. FINDINGS: Unchanged position of a right-sided pleural pigtail catheter. Mild cardiomegaly, unchanged. Unchanged small right pleural effusion with associated right basilar atelectasis and/or consolidation. No consolidation within the left lung. Possible trace left pleural effusion. No evidence of pneumothorax. Hiatal hernia. IMPRESSION: No significant change as compared to the chest radiograph of 03/06/2021. Unchanged position  of a right pleural pigtail catheter. Persistent small right pleural effusion with associated right basilar atelectasis and/or consolidation. Unchanged cardiomegaly. Hiatal hernia. Electronically Signed   By: Kellie Simmering DO   On: 03/07/2021 08:30   DG Chest Port 1 View  Result Date: 03/06/2021 CLINICAL DATA:  Shortness of breath, evaluate right pigtail pleural catheter. EXAM: PORTABLE CHEST 1 VIEW COMPARISON:  Chest radiograph dated 03/05/2021. FINDINGS: A right-sided pleural pigtail catheter overlies the right lower lung, similar to prior exam accounting for differences in patient positioning. A right-sided pleural effusion appears increased and is moderate in size. There is associated atelectasis/airspace disease which appears unchanged. The left lung is clear without pleural effusion. There is no pneumothorax. The heart is borderline enlarged. IMPRESSION: Interval increase in size of a moderate right pleural effusion. Unchanged right pleural pigtail catheter. Electronically Signed   By: Zerita Boers M.D.   On: 03/06/2021 15:20   DG CHEST PORT 1 VIEW  Result Date: 03/02/2021 CLINICAL DATA:  Chest tube EXAM: PORTABLE CHEST 1 VIEW COMPARISON:  03/01/2021 FINDINGS: Pigtail chest tube on the right unchanged in position. There appears to be a kink in the tube. No pneumothorax. Right pleural effusion/pleural thickening unchanged with right lower lobe airspace disease. Left lung remains clear.  Negative for heart failure. IMPRESSION: No interval change. Right chest tube in place with kinking of the tube. Right pleural effusion and right lower lobe airspace disease unchanged. No pneumothorax. Electronically Signed   By: Franchot Gallo M.D.   On: 03/02/2021 08:16   DG CHEST PORT 1 VIEW  Result Date: 03/01/2021 CLINICAL DATA:  Chest tube in place. EXAM: PORTABLE CHEST 1 VIEW COMPARISON:  February 27, 2021 FINDINGS: A right-sided chest tube is seen with its distal end overlying the right lung base. Moderate  severity right basilar consolidation is again seen. A stable right-sided pleural effusion is noted. No pneumothorax is identified. The cardiac silhouette is mildly enlarged. The visualized skeletal structures are unremarkable. IMPRESSION: 1. Interval right-sided chest tube placement positioning, as described above. Electronically Signed   By: Virgina Norfolk M.D.   On: 03/01/2021 17:47   DG CHEST PORT 1 VIEW  Result Date: 02/27/2021 CLINICAL DATA:  Empyema EXAM: PORTABLE CHEST 1 VIEW COMPARISON:  February 26, 2021 chest radiograph and chest CT FINDINGS: Consolidation with effusion is again noted in the lateral right base region. The focal empyema seen on CT is not well delineated by radiography. Lungs elsewhere clear. Heart is upper normal in size with pulmonary vascularity normal. No adenopathy. No bone lesions. IMPRESSION: Fluid and consolidation lateral right base persists. No new opacity evident. Stable cardiac silhouette. Electronically Signed   By: Lowella Grip III M.D.   On: 02/27/2021 12:31   CT IMAGE GUIDED FLUID DRAIN BY CATHETER  Result Date: 02/28/2021 Criselda Peaches, MD     02/28/2021 12:39  PM Interventional Radiology Procedure Note Procedure: Placement of a 67F pigtail thoracostomy tube into right sided empyema with evacuation of 65 mL foul smelling purulent fluid.  Sample sent for Cx. Complications: None Estimated Blood Loss: None Recommendations: - Drain to LWS at - 20 cm H2O - Cultures pending Signed, Criselda Peaches, MD     Subjective: - no chest pain, shortness of breath, no abdominal pain, nausea or vomiting.   Discharge Exam: BP 126/72 (BP Location: Right Arm)   Pulse 90   Temp 97.7 F (36.5 C) (Oral)   Resp 19   Ht 5\' 6"  (1.676 m)   Wt 69.7 kg   SpO2 99%   BMI 24.80 kg/m   General: Pt is alert, awake, not in acute distress Cardiovascular: RRR, S1/S2 +, no rubs, no gallops Respiratory: CTA bilaterally, no wheezing, no rhonchi Abdominal: Soft, NT, ND,  bowel sounds + Extremities: no edema, no cyanosis    The results of significant diagnostics from this hospitalization (including imaging, microbiology, ancillary and laboratory) are listed below for reference.     Microbiology: Recent Results (from the past 240 hour(s))  Culture, Urine     Status: None   Collection Time: 02/27/21  5:55 AM   Specimen: Urine, Random  Result Value Ref Range Status   Specimen Description URINE, RANDOM  Final   Special Requests NONE  Final   Culture   Final    NO GROWTH Performed at [facility], [address], [place], [place] [address]    Report Status 03/03/2021 FINAL  Final  Culture, blood (routine x 2)     Status: None   Collection Time: 02/27/21  4:37 PM   Specimen: BLOOD RIGHT HAND  Result Value Ref Range Status   Specimen Description BLOOD RIGHT HAND  Final   Special Requests   Final    BOTTLES DRAWN AEROBIC AND ANAEROBIC Blood Culture adequate volume   Culture   Final    NO GROWTH 5 DAYS Performed at [facility], [place] [address] [place], [place] [address]    Report Status 03/04/2021 FINAL  Final  Culture, blood (routine x 2)     Status: None   Collection Time: 02/27/21  4:48 PM   Specimen: BLOOD LEFT HAND  Result Value Ref Range Status   Specimen Description BLOOD LEFT HAND  Final   Special Requests   Final    BOTTLES DRAWN AEROBIC AND ANAEROBIC Blood Culture adequate volume   Culture   Final    NO GROWTH 5 DAYS Performed at [facility], [place] [address], [place], [place] [address]    Report Status 03/04/2021 FINAL  Final  Aerobic/Anaerobic Culture (surgical/deep wound)     Status: None   Collection Time: 02/28/21  1:04 PM   Specimen: Abscess  Result Value Ref Range Status   Specimen Description ABSCESS  Final   Special Requests EMPYEMA  Final   Gram Stain   Final    ABUNDANT WBC PRESENT,BOTH PMN AND MONONUCLEAR NO ORGANISMS SEEN    Culture   Final    No growth aerobically or anaerobically. Performed at  [facility], [place] [address], [place], [place] [address]    Report Status 03/05/2021 FINAL  Final  Resp Panel by RT-PCR (Flu A&B, Covid) Nasopharyngeal Swab     Status: None   Collection Time: 03/08/21 10:02 AM   Specimen: Nasopharyngeal Swab; Nasopharyngeal(NP) swabs in vial transport medium  Result Value Ref Range Status  SARS Coronavirus 2 by RT PCR NEGATIVE NEGATIVE Final    Comment: (NOTE) SARS-CoV-2 target nucleic acids are NOT DETECTED.  The SARS-CoV-2 RNA is generally detectable in upper respiratory specimens during the acute phase of infection. The lowest concentration of SARS-CoV-2 viral copies this assay can detect is 138 copies/mL. A negative result does not preclude SARS-Cov-2 infection and should not be used as the sole basis for treatment or other patient management decisions. A negative result may occur with  improper specimen collection/handling, submission of specimen other than nasopharyngeal swab, presence of viral mutation(s) within the areas targeted by this assay, and inadequate number of viral copies(<138 copies/mL). A negative result must be combined with clinical observations, patient history, and epidemiological information. The expected result is Negative.  Fact Sheet for Patients:  EntrepreneurPulse.com.au  Fact Sheet for Healthcare Providers:  IncredibleEmployment.be  This test is no t yet approved or cleared by the [place] FDA and  has been authorized for detection and/or diagnosis of SARS-CoV-2 by FDA under an Emergency Use Authorization (EUA). This EUA will remain  in effect (meaning this test can be used) for the duration of the COVID-19 declaration under Section 564(b)(1) of the Act, 21 U.S.C.section 360bbb-3(b)(1), unless the authorization is terminated  or revoked sooner.       Influenza A by PCR NEGATIVE NEGATIVE Final   Influenza B by PCR NEGATIVE NEGATIVE Final    Comment: (NOTE) The  Xpert Xpress SARS-CoV-2/FLU/RSV plus assay is intended as an aid in the diagnosis of influenza from Nasopharyngeal swab specimens and should not be used as a sole basis for treatment. Nasal washings and aspirates are unacceptable for Xpert Xpress SARS-CoV-2/FLU/RSV testing.  Fact Sheet for Patients: EntrepreneurPulse.com.au  Fact Sheet for Healthcare Providers: IncredibleEmployment.be  This test is not yet approved or cleared by the [place] FDA and has been authorized for detection and/or diagnosis of SARS-CoV-2 by FDA under an Emergency Use Authorization (EUA). This EUA will remain in effect (meaning this test can be used) for the duration of the COVID-19 declaration under Section 564(b)(1) of the Act, 21 U.S.C. section 360bbb-3(b)(1), unless the authorization is terminated or revoked.  Performed at [facility], [place] [address] [place], [place] [address]      Labs: Basic Metabolic Panel: Recent Labs  Lab 03/03/21 0407 03/04/21 0414 03/06/21 0133  NA 139 138 135  K 3.3* 3.2* 3.6  CL 106 104 101  CO2 24 26 26   GLUCOSE 107* 101* 90  BUN 15 9 8   CREATININE 0.65 0.58 0.58  CALCIUM 8.5* 8.5* 8.6*   Liver Function Tests: No results for input(s): AST, ALT, ALKPHOS, BILITOT, PROT, ALBUMIN in the last 168 hours. CBC: Recent Labs  Lab 03/03/21 0407 03/04/21 0414 03/06/21 0133  WBC 9.0 7.6 6.8  HGB 9.6* 9.6* 10.5*  HCT 30.1* 31.3* 34.4*  MCV 78.0* 78.6* 79.3*  PLT 545* 581* 614*   CBG: Recent Labs  Lab 03/05/21 0722 03/05/21 1134 03/05/21 2056 03/06/21 1955 03/07/21 2236  GLUCAP 91 117* 78 93 109*   Hgb A1c No results for input(s): HGBA1C in the last 72 hours. Lipid Profile No results for input(s): CHOL, HDL, LDLCALC, TRIG, CHOLHDL, LDLDIRECT in the last 72 hours. Thyroid function studies No results for input(s): TSH, T4TOTAL, T3FREE, THYROIDAB in the last 72 hours.  Invalid input(s): FREET3 Urinalysis     Component Value Date/Time   COLORURINE YELLOW 02/27/2021 0601   APPEARANCEUR HAZY (A) 02/27/2021 0601   LABSPEC >1.046 (H) 02/27/2021 7858  PHURINE 5.0 02/27/2021 0601   GLUCOSEU NEGATIVE 02/27/2021 0601   HGBUR MODERATE (A) 02/27/2021 0601   BILIRUBINUR NEGATIVE 02/27/2021 0601   KETONESUR NEGATIVE 02/27/2021 0601   PROTEINUR 100 (A) 02/27/2021 0601   NITRITE NEGATIVE 02/27/2021 0601   LEUKOCYTESUR LARGE (A) 02/27/2021 0601    FURTHER DISCHARGE INSTRUCTIONS:   Get Medicines reviewed and adjusted: Please take all your medications with you for your next visit with your Primary MD   Laboratory/radiological data: Please request your Primary MD to go over all hospital tests and procedure/radiological results at the follow up, please ask your Primary MD to get all Hospital records sent to his/her office.   In some cases, they will be blood work, cultures and biopsy results pending at the time of your discharge. Please request that your primary care M.D. goes through all the records of your hospital data and follows up on these results.   Also Note the following: If you experience worsening of your admission symptoms, develop shortness of breath, life threatening emergency, suicidal or homicidal thoughts you must seek medical attention immediately by calling 911 or calling your MD immediately  if symptoms less severe.   You must read complete instructions/literature along with all the possible adverse reactions/side effects for all the Medicines you take and that have been prescribed to you. Take any new Medicines after you have completely understood and accpet all the possible adverse reactions/side effects.    Do not drive when taking Pain medications or sleeping medications (Benzodaizepines)   Do not take more than prescribed Pain, Sleep and Anxiety Medications. It is not advisable to combine anxiety,sleep and pain medications without talking with your primary care practitioner    Special Instructions: If you have smoked or chewed Tobacco  in the last 2 yrs please stop smoking, stop any regular Alcohol  and or any Recreational drug use.   Wear Seat belts while driving.   Please note: You were cared for by a hospitalist during your hospital stay. Once you are discharged, your primary care physician will handle any further medical issues. Please note that NO REFILLS for any discharge medications will be authorized once you are discharged, as it is imperative that you return to your primary care physician (or establish a relationship with a primary care physician if you do not have one) for your post hospital discharge needs so that they can reassess your need for medications and monitor your lab values.  Time coordinating discharge: 35 minutes  SIGNED:  Marzetta Board, MD, PhD 03/09/2021, 6:20 AM

## 2021-03-09 MED ORDER — AMOXICILLIN-POT CLAVULANATE 875-125 MG PO TABS: 1.0000 | ORAL_TABLET | Freq: Two times a day (BID) | ORAL | Status: DC

## 2021-03-09 NOTE — TOC Transition Note (Signed)
Transition of Care [facility]) - CM/SW Discharge Note   Patient Details  Name: Karen Dyer MRN: [id number] Date of Birth: 1941-01-25  Transition [facility]) CM/SW Contact:  Angelita Ingles, RN Phone Number: [phone]  03/09/2021, 9:33 AM   Clinical Narrative:    5686 CM received call from [facility] stating that patient is ok to return to [place]. Transportation has been set up via [facility]. Bedside nurse made aware.  Please call report to  [facility] Room 202 A   Final next level of care: Skilled Nursing Facility Barriers to Discharge: Continued Medical Work up   Patient Goals and CMS Choice Patient states their goals for this hospitalization and ongoing recovery are:: Pt unable to participate in goal setting due to disorientation.   Choice offered to / list presented to : [name] POA / [name]  Discharge Placement              Patient chooses bed at: [place], [place] Patient to be transferred to facility by: [place] Name of family member notified: Attempted to call nephew [name]) several times no answer Patient and family notified of of transfer: 03/09/21  Discharge Plan and Services     Post Acute Care Choice: [facility]          DME Arranged: N/A DME Agency: NA       HH Arranged: NA HH Agency: NA        Social Determinants of Health (SDOH) Interventions     Readmission Risk Interventions No flowsheet data found.

## 2021-03-09 NOTE — TOC Progression Note (Addendum)
Transition of Care [facility]) - Progression Note    Patient Details  Name: Karen Dyer MRN: [id number] Date of Birth: 05/25/1941  Transition of Care [facility]) CM/SW [place], RN Phone Number: [phone]  03/09/2021, 8:10 AM  Clinical Narrative:    CM called [facility] message has been left for admissions director Olivia Mackie. Will await return call.     Expected Discharge Plan: [place] Barriers to Discharge: Continued Medical Work up  Expected Discharge Plan and Services Expected Discharge Plan: [place] Choice: [place] arrangements for the past 2 months: [place] Expected Discharge Date: 03/08/21                                     Social Determinants of Health (SDOH) Interventions    Readmission Risk Interventions No flowsheet data found.

## 2021-03-09 NOTE — Progress Notes (Signed)
Discharge: Pt d/c from room via stretcher and EMS Discharge instructions given to the patient AND placed in folder.  No questions from pt,  Pt dressed in slothes and left with discharge papers and prescriptions in packet. IV d/ced, tele removed and no complaints of pain or discomfort.

## 2021-03-09 NOTE — Progress Notes (Signed)
Seen this morning. Stable for discharge. See updated d/c summary 3/21  Costin M. Cruzita Lederer, MD, PhD [facility]  Between 7 am - 7 pm you can contact me via [place] or [place].  I am not available 7 pm - 7 am, please contact night coverage MD/APP via Amion

## 2021-03-30 ENCOUNTER — Ambulatory Visit (INDEPENDENT_AMBULATORY_CARE_PROVIDER_SITE_OTHER): Payer: Medicare Other | Admitting: Adult Health

## 2021-03-30 ENCOUNTER — Encounter: Payer: Self-pay | Admitting: Adult Health

## 2021-03-30 ENCOUNTER — Ambulatory Visit (INDEPENDENT_AMBULATORY_CARE_PROVIDER_SITE_OTHER): Payer: Medicare Other

## 2021-03-30 ENCOUNTER — Other Ambulatory Visit: Payer: Self-pay

## 2021-03-30 VITALS — BP 128/80 | HR 92 | Temp 98.2°F | Ht 60.0 in | Wt 148.0 lb

## 2021-03-30 DIAGNOSIS — R0902 Hypoxemia: Secondary | ICD-10-CM

## 2021-03-30 DIAGNOSIS — J869 Pyothorax without fistula: Secondary | ICD-10-CM

## 2021-03-30 NOTE — Assessment & Plan Note (Signed)
Resolved-O2 saturations 97% on room air

## 2021-03-30 NOTE — Assessment & Plan Note (Signed)
Right-sided empyema.-Clinically she is improving on prolonged antibiotics.  We will have her finish her full course of Augmentin.  Check chest x-ray today.  Plan  Patient Instructions  Chest xray today  Finish Augmentin as directed.  Activity as tolerated.  Aspiration precautions.  Follow up with Dr. Erin Fulling in 2 months and As needed   Please contact office for sooner follow up if symptoms do not improve or worsen or seek emergency care      '

## 2021-03-30 NOTE — Progress Notes (Signed)
@Patient  ID: Karen Dyer, female    DOB: 11-Sep-1941, 80 y.o.   MRN: [id number]  Chief Complaint  Patient presents with  . Follow-up    Referring provider: No ref. provider found  HPI: 80 year old female skilled nursing home resident with advanced dementia seen for pulmonary consult during hospitalization March 2022 with empyema  TEST/EVENTS :   03/30/2021 Follow up : Empyema  Patient returns for a post hospital follow-up.  Patient was recently seen in the hospital for pulmonary consult for empyema.  She had been admitted with hypoxemia and syncopal episode.  She is a skilled nursing home resident.  Patient says that she started having cold-like symptoms at that she has continued to worsen.  Patient was seen by pulmonary and a pigtail catheter chest tube was placed for drainage.  She did require TPA chest tube administration.  She was considered not a surgical/decortication candidate.  She was started on empiric antibiotics and recommended to complete a 4-week course of Augmentin Since discharge patient says she is feeling better.  She says her appetite is starting to pick up a little bit.  She will finish her Augmentin course on April 19.  Patient denies any hemoptysis chest pain orthopnea.  Patient is alone today for today's visit.  Does have a medical history of dementia but seems to answer questions appropriately.  Allergies  Allergen Reactions  . Aspirin Other (See Comments)    Unknown reaction - reported by [facility] 2017   . Other Other (See Comments)    Unknown reaction to Nuts, oranges - reported by [facility] 2017     There is no immunization history on file for this patient.  History reviewed. No pertinent past medical history.  Tobacco History: Social History   Tobacco Use  Smoking Status Former Smoker  Smokeless Tobacco Never Used   Counseling given: Not Answered   Outpatient Medications Prior to Visit  Medication Sig Dispense Refill  . acetaminophen  (TYLENOL) 500 MG tablet Take 1,000 mg by mouth every 12 (twelve) hours.    . ALPRAZolam (XANAX) 0.5 MG tablet Take 1 tablet (0.5 mg total) by mouth every morning. 6 tablet 0  . amoxicillin-clavulanate (AUGMENTIN) 875-125 MG tablet Take 1 tablet by mouth 2 (two) times daily for 28 days. 56 tablet 0  . Cholecalciferol (VITAMIN D3) 1.25 MG (50000 UT) CAPS Take 50,000 Units by mouth every Monday.    . lamoTRIgine (LAMICTAL) 200 MG tablet Take 200 mg by mouth every morning.    . melatonin 5 MG TABS Take 5 mg by mouth at bedtime.    . Nutritional Supplements (BOOST VERY HIGH CALORIE) LIQD Take 1 Can by mouth 2 (two) times daily.    . ondansetron (ZOFRAN) 4 MG tablet Take 4 mg by mouth every 6 (six) hours as needed for nausea or vomiting.    Marland Kitchen oxyCODONE (OXY IR/ROXICODONE) 5 MG immediate release tablet Take 1 tablet (5 mg total) by mouth every 4 (four) hours as needed (pain). 6 tablet 0  . pantoprazole (PROTONIX) 40 MG tablet Take 40 mg by mouth 2 (two) times daily.    . polyethylene glycol (MIRALAX / GLYCOLAX) 17 g packet Take 17 g by mouth every other day.    Marland Kitchen QUEtiapine (SEROQUEL) 25 MG tablet Take 37.5 mg by mouth at bedtime.     No facility-administered medications prior to visit.     Review of Systems:   Constitutional:   No  weight loss, night sweats,  Fevers, chills,  +  fatigue, or  lassitude.  HEENT:   No headaches,  Difficulty swallowing,  Tooth/dental problems, or  Sore throat,                No sneezing, itching, ear ache, nasal congestion, post nasal drip,   CV:  No chest pain,  Orthopnea, PND, swelling in lower extremities, anasarca, dizziness, palpitations, syncope.   GI  No heartburn, indigestion, abdominal pain, nausea, vomiting, diarrhea, change in bowel habits, loss of appetite, bloody stools.   Resp:   No chest wall deformity  Skin: no rash or lesions.  GU: no dysuria, change in color of urine, no urgency or frequency.  No flank pain, no hematuria   MS:  No joint pain  or swelling.  No decreased range of motion.  No back pain.    Physical Exam  BP 128/80 (BP Location: Right Arm, Cuff Size: Normal)   Pulse 92   Temp 98.2 F (36.8 C)   Ht 5' (1.524 m)   Wt 148 lb (67.1 kg)   SpO2 97%   BMI 28.90 kg/m   GEN: A/Ox3; pleasant , NAD, wheelchair   HEENT:  [place]/AT,    NOSE-clear, THROAT-clear, no lesions, no postnasal drip or exudate noted.  Edentulous  NECK:  Supple w/ fair ROM; no JVD; normal carotid impulses w/o bruits; no thyromegaly or nodules palpated; no lymphadenopathy.    RESP  Clear  P & A; w/o, wheezes/ rales/ or rhonchi. no accessory muscle use, no dullness to percussion  CARD:  RRR, no m/r/g, tr peripheral edema, pulses intact, no cyanosis or clubbing.  GI:   Soft & nt; nml bowel sounds; no organomegaly or masses detected.   Musco: Warm bil, no deformities or joint swelling noted.   Neuro: alert, no focal deficits noted.    Skin: Warm, no lesions or rashes    Lab Results:  CBC    Component Value Date/Time   WBC 6.8 03/06/2021 0133   RBC 4.34 03/06/2021 0133   HGB 10.5 (L) 03/06/2021 0133   HCT 34.4 (L) 03/06/2021 0133   PLT 614 (H) 03/06/2021 0133   MCV 79.3 (L) 03/06/2021 0133   MCH 24.2 (L) 03/06/2021 0133   MCHC 30.5 03/06/2021 0133   RDW 21.5 (H) 03/06/2021 0133   LYMPHSABS 2.3 02/28/2021 0648   MONOABS 1.1 (H) 02/28/2021 0648   EOSABS 0.4 02/28/2021 0648   BASOSABS 0.1 02/28/2021 0648    BMET    Component Value Date/Time   NA 135 03/06/2021 0133   K 3.6 03/06/2021 0133   CL 101 03/06/2021 0133   CO2 26 03/06/2021 0133   GLUCOSE 90 03/06/2021 0133   BUN 8 03/06/2021 0133   CREATININE 0.58 03/06/2021 0133   CALCIUM 8.6 (L) 03/06/2021 0133   GFRNONAA >60 03/06/2021 0133    BNP No results found for: BNP  ProBNP No results found for: PROBNP  Imaging: DG Chest 1 View  Result Date: 03/05/2021 CLINICAL DATA:  Empyema, chest tube EXAM: CHEST  1 VIEW COMPARISON:  Portable exam 0613 hours compared to  03/04/2021 FINDINGS: Pigtail RIGHT thoracostomy tube unchanged. Upper normal heart size. Pulmonary vascularity normal. LEFT paraspinal density at inferior chest likely a small hiatal hernia, seen on a prior CT. Volume loss in RIGHT hemithorax with atelectasis and pleural effusion at inferior RIGHT hemithorax unchanged. Remaining lungs clear. Skin folds project over LEFT lung. No pneumothorax or acute osseous findings. IMPRESSION: Persistent atelectasis and pleural effusion at RIGHT lung base. Electronically Signed   By:  Lavonia Dana M.D.   On: 03/05/2021 08:30   DG Chest 1 View  Result Date: 03/04/2021 CLINICAL DATA:  Chest tube in place EXAM: CHEST  1 VIEW COMPARISON:  March 02, 2021 FINDINGS: Pigtail catheter again noted on the right with apparent kink in the tube, stable. No pneumothorax evident. Partially loculated pleural effusion on the right with ill-defined airspace opacity in the right base persists. There is a small area of airspace opacity in the medial left base. Left lung otherwise clear. Heart is upper normal in size with pulmonary vascularity normal. No adenopathy. No bone lesions. IMPRESSION: Pigtail catheter again noted on the right with apparent kink in this catheter. There is a loculated pleural effusion on the right with atelectasis and potential superimposed pneumonia right base. Ill-defined opacity medial left base concerning for focus of pneumonia. Left lung otherwise clear. Stable cardiac prominence. Aortic Atherosclerosis (ICD10-I70.0). Electronically Signed   By: Lowella Grip III M.D.   On: 03/04/2021 08:25   DG Wrist 2 Views Right  Result Date: 03/03/2021 CLINICAL DATA:  Wrist pain after fall EXAM: RIGHT WRIST - 2 VIEW COMPARISON:  09/28/2020 FINDINGS: No acute displaced fracture or malalignment. Chronic fracture deformity of the distal radius. Soft tissues are unremarkable. IMPRESSION: No acute osseous abnormality. Electronically Signed   By: Donavan Foil M.D.   On:  03/03/2021 21:25   DG CHEST PORT 1 VIEW  Result Date: 03/07/2021 CLINICAL DATA:  Pleural effusion. EXAM: PORTABLE CHEST 1 VIEW COMPARISON:  Prior chest radiographs 03/06/2021 and earlier. CT chest 02/26/2021. FINDINGS: Unchanged position of a right-sided pleural pigtail catheter. Mild cardiomegaly, unchanged. Unchanged small right pleural effusion with associated right basilar atelectasis and/or consolidation. No consolidation within the left lung. Possible trace left pleural effusion. No evidence of pneumothorax. Hiatal hernia. IMPRESSION: No significant change as compared to the chest radiograph of 03/06/2021. Unchanged position of a right pleural pigtail catheter. Persistent small right pleural effusion with associated right basilar atelectasis and/or consolidation. Unchanged cardiomegaly. Hiatal hernia. Electronically Signed   By: Kellie Simmering DO   On: 03/07/2021 08:30   DG Chest Port 1 View  Result Date: 03/06/2021 CLINICAL DATA:  Shortness of breath, evaluate right pigtail pleural catheter. EXAM: PORTABLE CHEST 1 VIEW COMPARISON:  Chest radiograph dated 03/05/2021. FINDINGS: A right-sided pleural pigtail catheter overlies the right lower lung, similar to prior exam accounting for differences in patient positioning. A right-sided pleural effusion appears increased and is moderate in size. There is associated atelectasis/airspace disease which appears unchanged. The left lung is clear without pleural effusion. There is no pneumothorax. The heart is borderline enlarged. IMPRESSION: Interval increase in size of a moderate right pleural effusion. Unchanged right pleural pigtail catheter. Electronically Signed   By: Zerita Boers M.D.   On: 03/06/2021 15:20   DG CHEST PORT 1 VIEW  Result Date: 03/02/2021 CLINICAL DATA:  Chest tube EXAM: PORTABLE CHEST 1 VIEW COMPARISON:  03/01/2021 FINDINGS: Pigtail chest tube on the right unchanged in position. There appears to be a kink in the tube. No pneumothorax.  Right pleural effusion/pleural thickening unchanged with right lower lobe airspace disease. Left lung remains clear.  Negative for heart failure. IMPRESSION: No interval change. Right chest tube in place with kinking of the tube. Right pleural effusion and right lower lobe airspace disease unchanged. No pneumothorax. Electronically Signed   By: Franchot Gallo M.D.   On: 03/02/2021 08:16   DG CHEST PORT 1 VIEW  Result Date: 03/01/2021 CLINICAL DATA:  Chest tube in place.  EXAM: PORTABLE CHEST 1 VIEW COMPARISON:  February 27, 2021 FINDINGS: A right-sided chest tube is seen with its distal end overlying the right lung base. Moderate severity right basilar consolidation is again seen. A stable right-sided pleural effusion is noted. No pneumothorax is identified. The cardiac silhouette is mildly enlarged. The visualized skeletal structures are unremarkable. IMPRESSION: 1. Interval right-sided chest tube placement positioning, as described above. Electronically Signed   By: Virgina Norfolk M.D.   On: 03/01/2021 17:47      No flowsheet data found.  No results found for: NITRICOXIDE      Assessment & Plan:   Empyema, right ([place]) Right-sided empyema.-Clinically she is improving on prolonged antibiotics.  We will have her finish her full course of Augmentin.  Check chest x-ray today.  Plan  Patient Instructions  Chest xray today  Finish Augmentin as directed.  Activity as tolerated.  Aspiration precautions.  Follow up with Dr. Erin Fulling in 2 months and As needed   Please contact office for sooner follow up if symptoms do not improve or worsen or seek emergency care      '  Hypoxia Resolved-O2 saturations 97% on room air     Rexene Edison, NP 03/30/2021

## 2021-03-30 NOTE — Patient Instructions (Addendum)
Chest xray today  Finish Augmentin as directed.  Activity as tolerated.  Aspiration precautions.  Follow up with Dr. Erin Fulling in 2 months and As needed   Please contact office for sooner follow up if symptoms do not improve or worsen or seek emergency care

## 2021-04-01 NOTE — Progress Notes (Signed)
ATC results x1, a gentleman answered the phone, stated he is her POA, Gertie Baron, no DPR on file.  She is in [facility] in [place].  Spoke with [facility] the nurse at [facility], provided the recommendations per Rexene Edison NP.  She verbalized understanding.  As far as f/u, we need to schedule that with Debbie, I was transferred to her extension. I reached a vm and left a detailed message to return call to schedule f/u for patient.

## 2021-04-08 NOTE — Progress Notes (Signed)
Appointment scheduled for in June with Dr. Erin Fulling.  Nothing further needed.

## 2021-05-31 ENCOUNTER — Ambulatory Visit: Payer: Medicare Other | Admitting: Pulmonary Disease

## 2021-07-12 ENCOUNTER — Other Ambulatory Visit: Payer: Self-pay

## 2021-07-12 ENCOUNTER — Ambulatory Visit (INDEPENDENT_AMBULATORY_CARE_PROVIDER_SITE_OTHER): Payer: Medicare Other | Admitting: Pulmonary Disease

## 2021-07-12 ENCOUNTER — Encounter: Payer: Self-pay | Admitting: Pulmonary Disease

## 2021-07-12 VITALS — BP 124/70 | HR 99 | Temp 98.6°F | Resp 18 | Ht 61.0 in | Wt 166.4 lb

## 2021-07-12 DIAGNOSIS — J869 Pyothorax without fistula: Secondary | ICD-10-CM

## 2021-07-12 DIAGNOSIS — J929 Pleural plaque without asbestos: Secondary | ICD-10-CM | POA: Diagnosis not present

## 2021-07-12 NOTE — Progress Notes (Signed)
Synopsis: Referred in April 2022 for Empyema  Subjective:   PATIENT ID: Karen Dyer GENDER: female DOB: [birth year], MRN: [id number]   HPI  Chief Complaint  Patient presents with   Follow-up    Karen Dyer is an 80 year old woman, former smoker who returns to pulmonary clinic for follow up of empyema.   She was hospitalized March 2022 for acute hypoxemic respiratory failure and syncopal episode.  She was noted to have right pleural effusion on chest imaging and a pigtail chest tube was placed with concern for empyema.  She did receive tPA/DNase therapy via the chest tube as she is not a surgical candidate for decortication.  Patient had follow-up in our clinic on 03/30/2021 with stable appearance of right basilar pleural thickening and no increase in right pleural effusion since removal of the chest tube during hospitalization.  Patient reports that she has been doing well since her last visit.  She continues to have some chest discomfort on the right side but otherwise denies any shortness of breath, cough, sputum production, fever, lack of appetite or chills.  She denies any weight loss.  Past Medical History:  Diagnosis Date   Dementia ([place])      No family history on file.   Social History   Socioeconomic History   Marital status: Widowed    Spouse name: Not on file   Number of children: Not on file   Years of education: Not on file   Highest education level: Not on file  Occupational History   Not on file  Tobacco Use   Smoking status: Former   Smokeless tobacco: Never  Vaping Use   Vaping Use: Never used  Substance and Sexual Activity   Alcohol use: Not Currently   Drug use: Never   Sexual activity: Not Currently  Other Topics Concern   Not on file  Social History Narrative   Not on file   Social Determinants of Health   Financial Resource Strain: Not on file  Food Insecurity: Not on file  Transportation Needs: Not on file  Physical Activity: Not on file   Stress: Not on file  Social Connections: Not on file  Intimate Partner Violence: Not on file     Allergies  Allergen Reactions   Aspirin Other (See Comments)    Unknown reaction - reported by [facility] 2017    Other Other (See Comments)    Unknown reaction to Nuts, oranges - reported by [facility] 2017     Outpatient Medications Prior to Visit  Medication Sig Dispense Refill   acetaminophen (TYLENOL) 500 MG tablet Take 1,000 mg by mouth every 12 (twelve) hours.     ALPRAZolam (XANAX) 0.5 MG tablet Take 1 tablet (0.5 mg total) by mouth every morning. 6 tablet 0   Cholecalciferol (VITAMIN D3) 1.25 MG (50000 UT) CAPS Take 50,000 Units by mouth every Monday.     lamoTRIgine (LAMICTAL) 200 MG tablet Take 200 mg by mouth every morning.     melatonin 5 MG TABS Take 5 mg by mouth at bedtime.     Nutritional Supplements (BOOST VERY HIGH CALORIE) LIQD Take 1 Can by mouth 2 (two) times daily.     ondansetron (ZOFRAN) 4 MG tablet Take 4 mg by mouth every 6 (six) hours as needed for nausea or vomiting.     oxyCODONE (OXY IR/ROXICODONE) 5 MG immediate release tablet Take 1 tablet (5 mg total) by mouth every 4 (four) hours as needed (pain). 6 tablet  0   pantoprazole (PROTONIX) 40 MG tablet Take 40 mg by mouth 2 (two) times daily.     polyethylene glycol (MIRALAX / GLYCOLAX) 17 g packet Take 17 g by mouth every other day.     QUEtiapine (SEROQUEL) 25 MG tablet Take 37.5 mg by mouth at bedtime.     No facility-administered medications prior to visit.    Review of Systems  Constitutional:  Negative for chills, fever, malaise/fatigue and weight loss.  HENT:  Negative for congestion, sinus pain and sore throat.   Eyes: Negative.   Respiratory:  Negative for cough, hemoptysis, sputum production, shortness of breath and wheezing.   Cardiovascular:  Negative for chest pain, palpitations, orthopnea, claudication and leg swelling.  Gastrointestinal:  Negative for abdominal pain, heartburn,  nausea and vomiting.  Genitourinary: Negative.   Musculoskeletal:  Negative for joint pain and myalgias.  Skin:  Negative for rash.  Neurological:  Negative for weakness.  Endo/Heme/Allergies: Negative.   Psychiatric/Behavioral: Negative.     Objective:   Vitals:   07/12/21 1012  BP: 124/70  Pulse: 99  Resp: 18  Temp: 98.6 F (37 C)  TempSrc: Oral  SpO2: 98%  Weight: 166 lb 6.4 oz (75.5 kg)  Height: '5\' 1"'$  (1.549 m)   Physical Exam Constitutional:      General: She is not in acute distress.    Appearance: She is not ill-appearing.  HENT:     Head: Normocephalic and atraumatic.  Eyes:     General: No scleral icterus.    Conjunctiva/sclera: Conjunctivae normal.     Pupils: Pupils are equal, round, and reactive to light.  Cardiovascular:     Rate and Rhythm: Normal rate and regular rhythm.     Pulses: Normal pulses.     Heart sounds: Normal heart sounds. No murmur heard. Pulmonary:     Effort: Pulmonary effort is normal.     Breath sounds: Normal breath sounds. No wheezing, rhonchi or rales.  Abdominal:     General: Bowel sounds are normal.     Palpations: Abdomen is soft.  Musculoskeletal:     Right lower leg: No edema.     Left lower leg: No edema.  Lymphadenopathy:     Cervical: No cervical adenopathy.  Skin:    General: Skin is warm and dry.  Neurological:     General: No focal deficit present.     Mental Status: She is alert.  Psychiatric:        Mood and Affect: Mood normal.        Behavior: Behavior normal.        Thought Content: Thought content normal.        Judgment: Judgment normal.   CBC    Component Value Date/Time   WBC 6.8 03/06/2021 0133   RBC 4.34 03/06/2021 0133   HGB 10.5 (L) 03/06/2021 0133   HCT 34.4 (L) 03/06/2021 0133   PLT 614 (H) 03/06/2021 0133   MCV 79.3 (L) 03/06/2021 0133   MCH 24.2 (L) 03/06/2021 0133   MCHC 30.5 03/06/2021 0133   RDW 21.5 (H) 03/06/2021 0133   LYMPHSABS 2.3 02/28/2021 0648   MONOABS 1.1 (H) 02/28/2021  0648   EOSABS 0.4 02/28/2021 0648   BASOSABS 0.1 02/28/2021 0648   BMP Latest Ref Rng & Units 03/06/2021 03/04/2021 03/03/2021  Glucose 70 - 99 mg/dL 90 101(H) 107(H)  BUN 8 - 23 mg/dL '8 9 15  '$ Creatinine 0.44 - 1.00 mg/dL 0.58 0.58 0.65  Sodium 135 - 145  mmol/L 135 138 139  Potassium 3.5 - 5.1 mmol/L 3.6 3.2(L) 3.3(L)  Chloride 98 - 111 mmol/L 101 104 106  CO2 22 - 32 mmol/L '26 26 24  '$ Calcium 8.9 - 10.3 mg/dL 8.6(L) 8.5(L) 8.5(L)   Chest imaging: CXR 03/30/21 Interval removal of the pigtail drainage catheter of the right chest. Persisting opacity at the right lung base with pleuroparenchymal thickening, compatible with residual pleural fluid/scarring. Background of chronic lung changes and emphysema.   CXR 03/07/21 Unchanged position of a right pleural pigtail catheter. Persistent small right pleural effusion with associated right basilar atelectasis and/or consolidation. Unchanged cardiomegaly. Hiatal hernia.  PFT: No flowsheet data found.    Assessment & Plan:   Empyema, right ([facility])  Pleural thickening  Discussion: Estaline Friedrichs is an 80 year old woman, former smoker who returns to pulmonary clinic for follow up of empyema.   Patient's chest imaging reviewed and remained stable from the time of discharge in March to the time of follow-up in April where she has chronic right basilar pleural thickening with possible small remaining effusion.  She is clinically doing well at this time and we discussed should she develop worsening right-sided chest pain, fevers, malaise, weight loss, lack of appetite, cough and sputum production that she should give [place] a call to be seen.  Otherwise I am happy with her progress at this time and she does not need to have scheduled follow-up and I do not recommend further chest imaging at this time.  She is to maintain a dysphagia appropriate diet given her history of aspiration.  Follow-up as needed.  Karen Jackson, MD [facility] Office: [phone]   Current Outpatient Medications:    acetaminophen (TYLENOL) 500 MG tablet, Take 1,000 mg by mouth every 12 (twelve) hours., Disp: , Rfl:    ALPRAZolam (XANAX) 0.5 MG tablet, Take 1 tablet (0.5 mg total) by mouth every morning., Disp: 6 tablet, Rfl: 0   Cholecalciferol (VITAMIN D3) 1.25 MG (50000 UT) CAPS, Take 50,000 Units by mouth every Monday., Disp: , Rfl:    lamoTRIgine (LAMICTAL) 200 MG tablet, Take 200 mg by mouth every morning., Disp: , Rfl:    melatonin 5 MG TABS, Take 5 mg by mouth at bedtime., Disp: , Rfl:    Nutritional Supplements (BOOST VERY HIGH CALORIE) LIQD, Take 1 Can by mouth 2 (two) times daily., Disp: , Rfl:    ondansetron (ZOFRAN) 4 MG tablet, Take 4 mg by mouth every 6 (six) hours as needed for nausea or vomiting., Disp: , Rfl:    oxyCODONE (OXY IR/ROXICODONE) 5 MG immediate release tablet, Take 1 tablet (5 mg total) by mouth every 4 (four) hours as needed (pain)., Disp: 6 tablet, Rfl: 0   pantoprazole (PROTONIX) 40 MG tablet, Take 40 mg by mouth 2 (two) times daily., Disp: , Rfl:    polyethylene glycol (MIRALAX / GLYCOLAX) 17 g packet, Take 17 g by mouth every other day., Disp: , Rfl:    QUEtiapine (SEROQUEL) 25 MG tablet, Take 37.5 mg by mouth at bedtime., Disp: , Rfl:

## 2021-07-12 NOTE — Patient Instructions (Signed)
Patient had empyema secondary to aspiration pneumonia.  She has chronic scarring and thickening of the right pleural base on her chest x-rays.  Clinically she is doing well at this time and I do not recommend scheduled follow-up in the future unless she has a decline in her functional status with decreased oral intake, weight loss, increased chest pain or increased shortness of breath.  Please call our office if she develops any of the symptoms.

## 2022-12-07 IMAGING — DX DG CHEST 1V
1 series · 1 of 1 positions shown · non-contrast
Comparison: Portable exam 3148 hours compared to 03/04/2021

CLINICAL DATA: Empyema, chest tube

EXAM:
CHEST  1 VIEW

[chest ap]
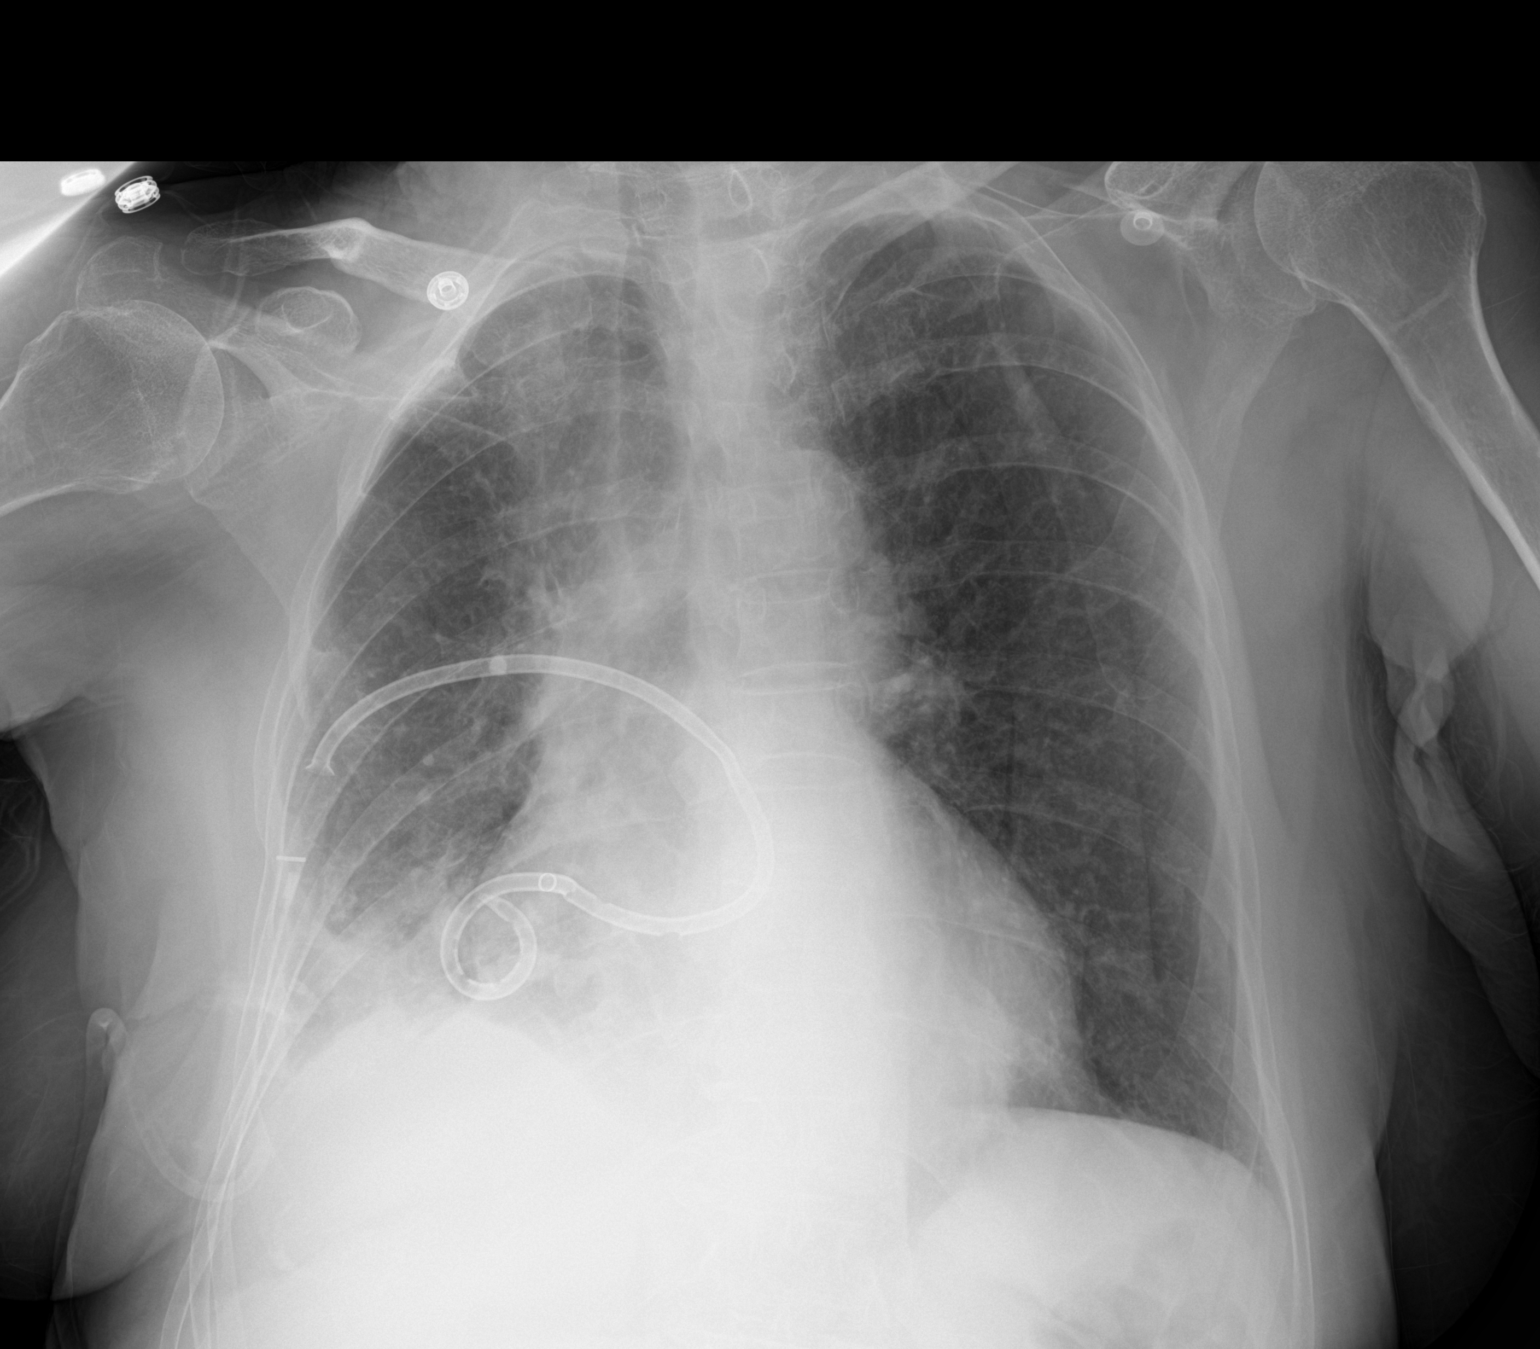

[1 of 1 positions shown; findings below may reference images not displayed]

FINDINGS: Pigtail RIGHT thoracostomy tube unchanged.

Upper normal heart size.

Pulmonary vascularity normal.

LEFT paraspinal density at inferior chest likely a small hiatal
hernia, seen on a prior CT.

Volume loss in RIGHT hemithorax with atelectasis and pleural
effusion at inferior RIGHT hemithorax unchanged.

Remaining lungs clear.

Skin folds project over LEFT lung.

No pneumothorax or acute osseous findings.
IMPRESSION: Persistent atelectasis and pleural effusion at RIGHT lung base.

## 2023-01-01 IMAGING — DX DG CHEST 2V
2 series · 2 of 2 positions shown · non-contrast
Comparison: 03/07/2021

CLINICAL DATA: 79-year-old female with emphysema

EXAM:
CHEST - 2 VIEW

[chest pa]
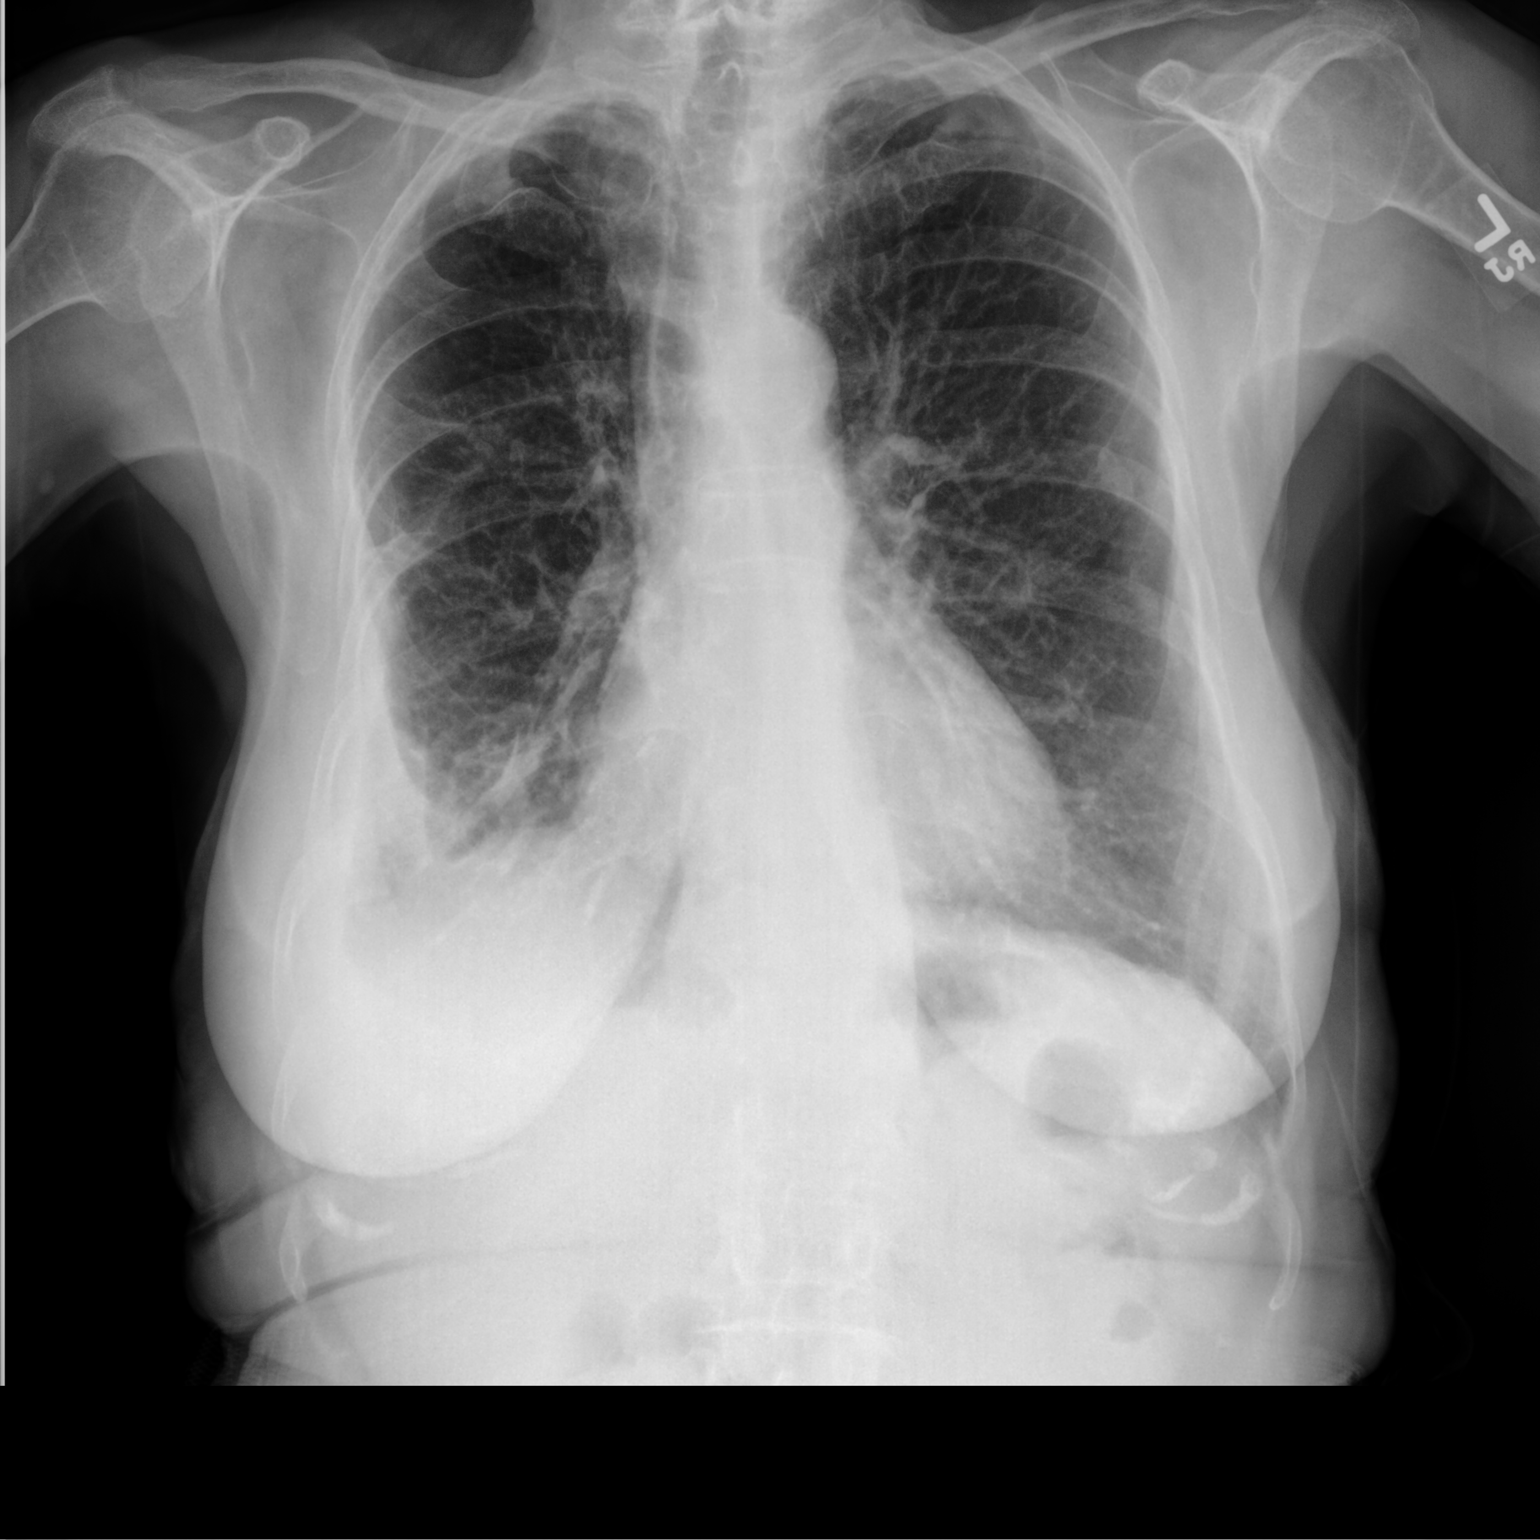

[chest lat]
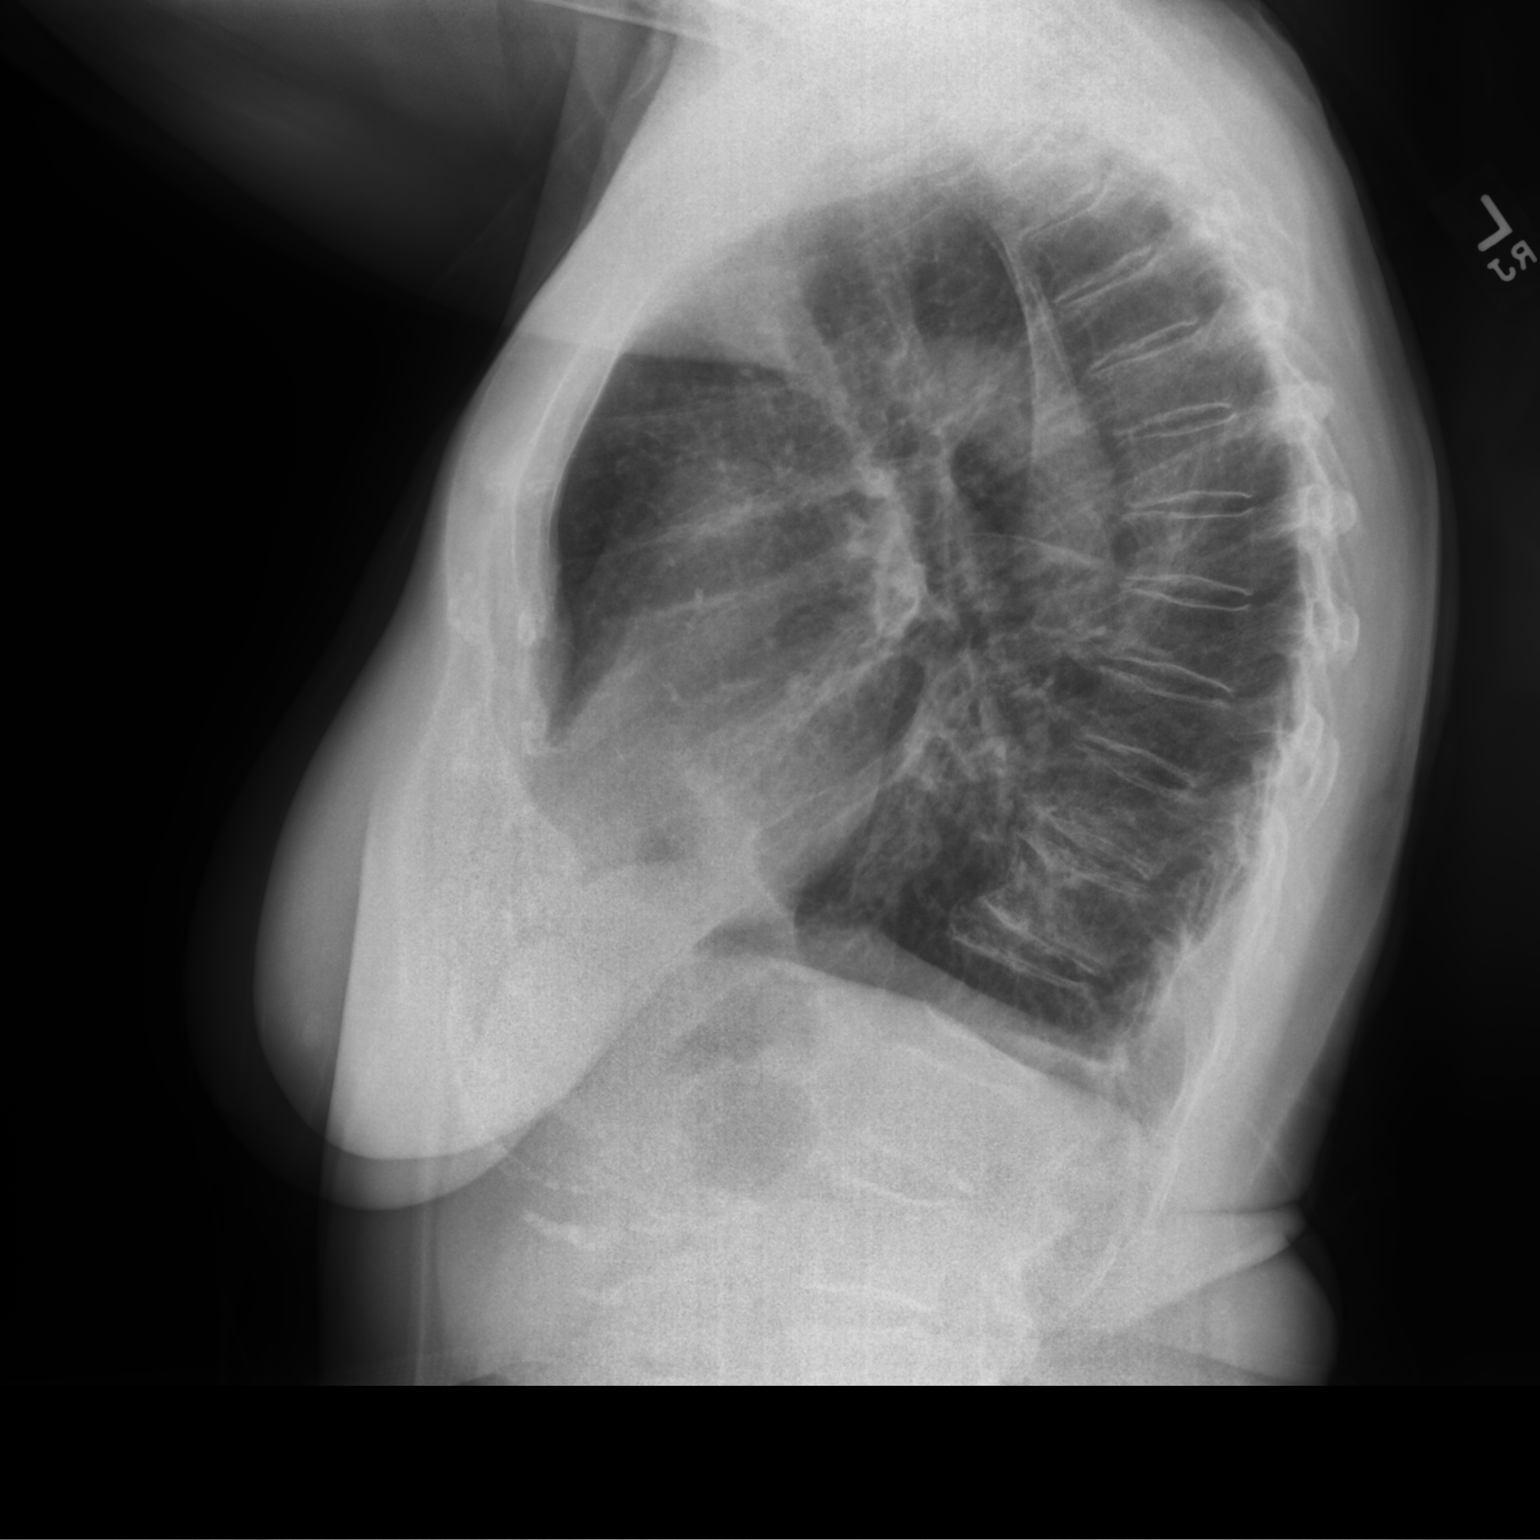

[2 of 2 positions shown; findings below may reference images not displayed]

FINDINGS: Cardiomediastinal silhouette unchanged in size and contour.

Interval removal of the pigtail drainage catheter of the right
chest.

Persisting right-sided pleuroparenchymal thickening at the periphery
of the lung, with persisting opacity at the right lung base
obscuring the right hemidiaphragm. Bilateral reticular opacities
persist.

Stigmata of emphysema, with increased retrosternal airspace,
flattened hemidiaphragms, increased AP diameter, and hyperinflation
on the AP view.

No pneumothorax.

Degenerative changes the spine.  No acute displaced fracture
IMPRESSION: Interval removal of the pigtail drainage catheter of the right
chest.

Persisting opacity at the right lung base with pleuroparenchymal
thickening, compatible with residual pleural fluid/scarring.

Background of chronic lung changes and emphysema.
